# Patient Record
Sex: Male | Born: 1995 | Race: Black or African American | Hispanic: No | Marital: Single | Smoking: Never smoker
Health system: Southern US, Community
[De-identification: ages and names within clinical notes are randomized; demographics above are authoritative.]

## PROBLEM LIST (undated history)

## (undated) DIAGNOSIS — J309 Allergic rhinitis, unspecified: Secondary | ICD-10-CM

## (undated) DIAGNOSIS — M4120 Other idiopathic scoliosis, site unspecified: Secondary | ICD-10-CM

## (undated) DIAGNOSIS — K219 Gastro-esophageal reflux disease without esophagitis: Secondary | ICD-10-CM

## (undated) DIAGNOSIS — J45909 Unspecified asthma, uncomplicated: Secondary | ICD-10-CM

## (undated) DIAGNOSIS — F419 Anxiety disorder, unspecified: Secondary | ICD-10-CM

## (undated) HISTORY — DX: Unspecified asthma, uncomplicated: J45.909

## (undated) HISTORY — DX: Allergic rhinitis, unspecified: J30.9

## (undated) HISTORY — DX: Gastro-esophageal reflux disease without esophagitis: K21.9

## (undated) HISTORY — DX: Other idiopathic scoliosis, site unspecified: M41.20

## (undated) HISTORY — PX: WISDOM TOOTH EXTRACTION: SHX21

## (undated) HISTORY — DX: Anxiety disorder, unspecified: F41.9

---
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
## 2013-06-03 ENCOUNTER — Ambulatory Visit: Payer: Commercial Managed Care - PPO | Admitting: Family Medicine

## 2013-06-06 ENCOUNTER — Ambulatory Visit (INDEPENDENT_AMBULATORY_CARE_PROVIDER_SITE_OTHER): Payer: Commercial Managed Care - PPO | Admitting: Internal Medicine

## 2013-06-06 ENCOUNTER — Encounter: Payer: Self-pay | Admitting: Internal Medicine

## 2013-06-06 VITALS — BP 108/82 | HR 80 | Temp 98.6°F | Resp 16 | Wt 165.0 lb

## 2013-06-06 DIAGNOSIS — M545 Low back pain, unspecified: Secondary | ICD-10-CM | POA: Insufficient documentation

## 2013-06-06 DIAGNOSIS — M25532 Pain in left wrist: Secondary | ICD-10-CM

## 2013-06-06 DIAGNOSIS — M928 Other specified juvenile osteochondrosis: Secondary | ICD-10-CM

## 2013-06-06 DIAGNOSIS — S60219A Contusion of unspecified wrist, initial encounter: Secondary | ICD-10-CM

## 2013-06-06 DIAGNOSIS — M25579 Pain in unspecified ankle and joints of unspecified foot: Secondary | ICD-10-CM | POA: Insufficient documentation

## 2013-06-06 DIAGNOSIS — S60212A Contusion of left wrist, initial encounter: Secondary | ICD-10-CM

## 2013-06-06 DIAGNOSIS — M25539 Pain in unspecified wrist: Secondary | ICD-10-CM

## 2013-06-06 MED ORDER — IBUPROFEN 600 MG PO TABS: 600.0000 mg | ORAL_TABLET | Freq: Three times a day (TID) | ORAL | Status: DC | PRN

## 2013-06-06 NOTE — Progress Notes (Signed)
  Subjective:    HPI   C/o L wrist pain ( L handed)x 2-4 wks  C/o LBP worse w/ROM - C/o R ankle pain - twisted C/o B knee pain F/u anxiety - months (crowds, open spaces, shy) - doing ok   Review of Systems  Constitutional: Negative for chills, appetite change, fatigue and unexpected weight change.  HENT: Negative for congestion, nosebleeds, sneezing, sore throat and trouble swallowing.   Eyes: Negative for itching and visual disturbance.  Cardiovascular: Negative for leg swelling.  Gastrointestinal: Negative for diarrhea, blood in stool and abdominal distention.  Genitourinary: Negative for frequency, hematuria and testicular pain.  Musculoskeletal: Positive for arthralgias and back pain. Negative for gait problem, joint swelling and neck pain.  Skin: Negative for rash.  Neurological: Negative for tremors and speech difficulty.  Psychiatric/Behavioral: Negative for suicidal ideas, hallucinations, behavioral problems, sleep disturbance, dysphoric mood, decreased concentration and agitation. The patient is nervous/anxious.        Objective:   Physical Exam  Constitutional: He is oriented to person, place, and time. He appears well-developed and well-nourished. No distress.  Tall and thin, NAD  HENT:  Mouth/Throat: Oropharynx is clear and moist.  Eyes: Conjunctivae are normal. Pupils are equal, round, and reactive to light.  Neck: Normal range of motion. No JVD present. No thyromegaly present.  Cardiovascular: Normal rate, regular rhythm, normal heart sounds and intact distal pulses.  Exam reveals no gallop and no friction rub.   No murmur heard. Pulmonary/Chest: Effort normal and breath sounds normal. No respiratory distress. He has no wheezes. He has no rales. He exhibits no tenderness.  Abdominal: Soft. Bowel sounds are normal. He exhibits no distension and no mass. There is no tenderness. There is no rebound and no guarding.  Musculoskeletal: Normal range of motion. He exhibits  tenderness. He exhibits no edema.  LS R scolio L dors wrist is a little tender w/palp and ROM   Lymphadenopathy:    He has no cervical adenopathy.  Neurological: He is alert and oriented to person, place, and time. He has normal reflexes. No cranial nerve deficit. He exhibits normal muscle tone. Coordination normal.  Skin: Skin is warm and dry. No rash noted.  Psychiatric: He has a normal mood and affect. His behavior is normal. Judgment and thought content normal.  shy  L wrist is tender dorsally - ?cyst LS is tender     Assessment & Plan:    We discussed his diet and gym activities

## 2013-06-06 NOTE — Progress Notes (Signed)
Pre visit review using our clinic review tool, if applicable. No additional management support is needed unless otherwise documented below in the visit note. 

## 2013-06-06 NOTE — Assessment & Plan Note (Signed)
B knee pain Sports Med consult NSAIDs  Vit D

## 2013-06-06 NOTE — Assessment & Plan Note (Signed)
Arch supports

## 2013-06-06 NOTE — Assessment & Plan Note (Addendum)
2/15 ?dorsal ganglion Sports Med consult NSAIDs  Vit D

## 2013-06-06 NOTE — Assessment & Plan Note (Signed)
See Rx Sports med ref

## 2013-06-20 ENCOUNTER — Ambulatory Visit (INDEPENDENT_AMBULATORY_CARE_PROVIDER_SITE_OTHER): Payer: Commercial Managed Care - PPO | Admitting: Family Medicine

## 2013-06-20 ENCOUNTER — Encounter: Payer: Self-pay | Admitting: Family Medicine

## 2013-06-20 ENCOUNTER — Other Ambulatory Visit (INDEPENDENT_AMBULATORY_CARE_PROVIDER_SITE_OTHER): Payer: Commercial Managed Care - PPO

## 2013-06-20 VITALS — BP 120/70 | HR 69 | Temp 97.8°F | Resp 16 | Wt 162.1 lb

## 2013-06-20 DIAGNOSIS — M2140 Flat foot [pes planus] (acquired), unspecified foot: Secondary | ICD-10-CM

## 2013-06-20 DIAGNOSIS — M25579 Pain in unspecified ankle and joints of unspecified foot: Secondary | ICD-10-CM

## 2013-06-20 NOTE — Assessment & Plan Note (Signed)
Discussed OTC medications,  Icing and ankle brace.  HEP given OTC orthotics suggested.  Come back again in 3 weeks.

## 2013-06-20 NOTE — Patient Instructions (Signed)
It was nice meeting you today Terry West. Please perform your ankle exercises one to two x per day Please ice 1-2 x per day as well. Please get orthotics over the counter to help support your arch. I will see you back in 3-4 weeks to see how you are doing.

## 2013-06-20 NOTE — Progress Notes (Signed)
Tawana ScaleZach Smith D.O. [facility] [address] [place], [place] [address] Phone: [phone] Subjective:    I'm seeing this patient by the request  of:  Sonda PrimesAlex Plotnikov, MD   CC: Right ankle pain  NGE:[id number]:Subjective Terry West is a 18 y.o. male coming in with complaint of right ankle pain. Patient does work in [employer] he jumped off of a ledge causing him to roll his ankle about 3 weeks ago. Patient states that the swelling resolved after approximately 24 hours but continue to have some discomfort even with ambulation no. Patient states that the pain seems to be the whole ankle joint itself. Patient denies any clicking popping or giving on him. Patient denies any radiation of the pain but states that he has a dull ache that is worse with ambulation or standing for a long amount of time. Patient has not tried any over-the-counter medications for anything at this time. Patient has been told before that he had had ankles but did not know what that means. Patient rates the severity a 4/10. No nighttime awakening.     Past medical history, social, surgical and family history all reviewed in electronic medical record.   Review of Systems: No headache, visual changes, nausea, vomiting, diarrhea, constipation, dizziness, abdominal pain, skin rash, fevers, chills, night sweats, weight loss, swollen lymph nodes, body aches, joint swelling, muscle aches, chest pain, shortness of breath, mood changes.   Objective Blood pressure 120/70, pulse 69, temperature 97.8 F (36.6 C), temperature source Oral, resp. rate 16, weight 162 lb 1.3 oz (73.519 kg), SpO2 96.00%.  General: No apparent distress alert and oriented x3 mood and affect normal, dressed appropriately.  HEENT: Pupils equal, extraocular movements intact  Respiratory: Patient's speak in full sentences and does not appear short of breath  Cardiovascular: No lower extremity edema, non tender, no erythema  Skin: Warm dry intact with no  signs of infection or rash on extremities or on axial skeleton.  Abdomen: Soft nontender  Neuro: Cranial nerves II through XII are intact, neurovascularly intact in all extremities with 2+ DTRs and 2+ pulses.  Lymph: No lymphadenopathy of posterior or anterior cervical chain or axillae bilaterally.  Gait normal with good balance and coordination.  MSK:  Non tender with full range of motion and good stability and symmetric strength and tone of shoulders, elbows, wrist, hip, knee and ankles bilaterally. 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 Patient has breakdown spring-ligament. Mild tibial deviation of the large toe bilateral  MSK US performed of: right This study was ordered, performed, and interpreted by Terrilee FilesZach Smith D.O.  Foot/Ankle:   All structures visualized.   Talar dome unremarkable  Ankle mortise without effusion. Peroneus longus and brevis tendons unremarkable on long and transverse views without sheath effusions. Posterior tibialis, flexor hallucis longus, and flexor digitorum longus tendons unremarkable on long and transverse views without sheath effusions. Achilles tendon visualized along length of tendon and unremarkable on long and transverse views without sheath effusion. Anterior Talofibular Ligament and Calcaneofibular Ligaments unremarkable and intact. Deltoid Ligament unremarkable and intact. Plantar fascia intact and without effusion, normal thickness. No increased doppler signal, cap sign, or thickening of tibial cortex. Power doppler signal normal.  IMPRESSION:  NORMAL ULTRASONOGRAPHIC EXAMINATION OF THE FOOT/ANKLE.     Impression and Recommendations:     This case required medical  decision making of moderate complexity.

## 2013-06-20 NOTE — Progress Notes (Signed)
Pre visit review using our clinic review tool, if applicable. No additional management support is needed unless otherwise documented below in the visit note. 

## 2013-07-09 ENCOUNTER — Encounter: Payer: Self-pay | Admitting: Internal Medicine

## 2013-07-09 ENCOUNTER — Ambulatory Visit (INDEPENDENT_AMBULATORY_CARE_PROVIDER_SITE_OTHER): Payer: Commercial Managed Care - PPO | Admitting: Internal Medicine

## 2013-07-09 VITALS — BP 124/80 | HR 80 | Temp 99.4°F | Resp 16 | Wt 163.0 lb

## 2013-07-09 DIAGNOSIS — M545 Low back pain, unspecified: Secondary | ICD-10-CM

## 2013-07-09 DIAGNOSIS — J45909 Unspecified asthma, uncomplicated: Secondary | ICD-10-CM

## 2013-07-09 DIAGNOSIS — M928 Other specified juvenile osteochondrosis: Secondary | ICD-10-CM

## 2013-07-09 NOTE — Assessment & Plan Note (Signed)
Waxing and waning sx's

## 2013-07-09 NOTE — Progress Notes (Deleted)
Pre visit review using our clinic review tool, if applicable. No additional management support is needed unless otherwise documented below in the visit note. 

## 2013-07-09 NOTE — Assessment & Plan Note (Signed)
Continue with current prescription therapy as reflected on the Med list.  

## 2013-07-09 NOTE — Assessment & Plan Note (Signed)
Better  

## 2013-07-09 NOTE — Progress Notes (Deleted)
   Subjective:    Patient ID: Terry West, male    DOB: 1996-01-01, 18 y.o.   MRN: [id number]  HPI    Review of Systems     Objective:   Physical Exam        Assessment & Plan:

## 2013-07-09 NOTE — Progress Notes (Signed)
  Subjective:    HPI   F/u L wrist pain ( L handed)x 2-4 wks, LBP worse w/ROM, R ankle pain - better - he saw Dr Katrinka BlazingSmith F/u B knee pain  F/u anxiety - months (crowds, open spaces, shy) - doing ok   Review of Systems  Constitutional: Negative for chills, appetite change, fatigue and unexpected weight change.  HENT: Negative for congestion, nosebleeds, sneezing, sore throat and trouble swallowing.   Eyes: Negative for itching and visual disturbance.  Cardiovascular: Negative for leg swelling.  Gastrointestinal: Negative for diarrhea, blood in stool and abdominal distention.  Genitourinary: Negative for frequency, hematuria and testicular pain.  Musculoskeletal: Positive for arthralgias and back pain. Negative for gait problem, joint swelling and neck pain.  Skin: Negative for rash.  Neurological: Negative for tremors and speech difficulty.  Psychiatric/Behavioral: Negative for suicidal ideas, hallucinations, behavioral problems, sleep disturbance, dysphoric mood, decreased concentration and agitation. The patient is nervous/anxious.        Objective:   Physical Exam  Constitutional: He is oriented to person, place, and time. He appears well-developed and well-nourished. No distress.  Tall and thin, NAD  HENT:  Mouth/Throat: Oropharynx is clear and moist.  Eyes: Conjunctivae are normal. Pupils are equal, round, and reactive to light.  Neck: Normal range of motion. No JVD present. No thyromegaly present.  Cardiovascular: Normal rate, regular rhythm, normal heart sounds and intact distal pulses.  Exam reveals no gallop and no friction rub.   No murmur heard. Pulmonary/Chest: Effort normal and breath sounds normal. No respiratory distress. He has no wheezes. He has no rales. He exhibits no tenderness.  Abdominal: Soft. Bowel sounds are normal. He exhibits no distension and no mass. There is no tenderness. There is no rebound and no guarding.  Musculoskeletal: Normal range of motion. He  exhibits tenderness. He exhibits no edema.  LS R scolio L dors wrist is a little tender w/palp and ROM   Lymphadenopathy:    He has no cervical adenopathy.  Neurological: He is alert and oriented to person, place, and time. He has normal reflexes. No cranial nerve deficit. He exhibits normal muscle tone. Coordination normal.  Skin: Skin is warm and dry. No rash noted.  Psychiatric: He has a normal mood and affect. His behavior is normal. Judgment and thought content normal.  shy  L wrist is tender dorsally - ?cyst LS is less tender     Assessment & Plan:    We discussed his diet and gym activities

## 2013-09-05 ENCOUNTER — Ambulatory Visit (INDEPENDENT_AMBULATORY_CARE_PROVIDER_SITE_OTHER): Payer: Commercial Managed Care - PPO | Admitting: Internal Medicine

## 2013-09-05 ENCOUNTER — Encounter: Payer: Self-pay | Admitting: Internal Medicine

## 2013-09-05 VITALS — BP 118/82 | HR 80 | Temp 98.1°F | Resp 16 | Ht 70.5 in | Wt 163.0 lb

## 2013-09-05 DIAGNOSIS — H6120 Impacted cerumen, unspecified ear: Secondary | ICD-10-CM

## 2013-09-05 DIAGNOSIS — H6123 Impacted cerumen, bilateral: Secondary | ICD-10-CM | POA: Insufficient documentation

## 2013-09-05 NOTE — Progress Notes (Signed)
   Subjective:    HPI   C/o B ear fullness C/o R foot big toe bruise  Review of Systems  Constitutional: Negative for chills, appetite change, fatigue and unexpected weight change.  HENT: Negative for congestion, nosebleeds, sneezing, sore throat and trouble swallowing.   Eyes: Negative for itching and visual disturbance.  Cardiovascular: Negative for leg swelling.  Gastrointestinal: Negative for diarrhea, blood in stool and abdominal distention.  Genitourinary: Negative for frequency, hematuria and testicular pain.  Musculoskeletal: Positive for arthralgias and back pain. Negative for gait problem, joint swelling and neck pain.  Skin: Negative for rash.  Neurological: Negative for tremors and speech difficulty.  Psychiatric/Behavioral: Negative for suicidal ideas, hallucinations, behavioral problems, sleep disturbance, dysphoric mood, decreased concentration and agitation. The patient is nervous/anxious.        Objective:   Physical Exam  Constitutional: He is oriented to person, place, and time. He appears well-developed and well-nourished. No distress.  Tall and thin, NAD  HENT:  Mouth/Throat: Oropharynx is clear and moist.  Eyes: Conjunctivae are normal. Pupils are equal, round, and reactive to light.  Neck: Normal range of motion. No JVD present. No thyromegaly present.  Cardiovascular: Normal rate, regular rhythm, normal heart sounds and intact distal pulses.  Exam reveals no gallop and no friction rub.   No murmur heard. Pulmonary/Chest: Effort normal and breath sounds normal. No respiratory distress. He has no wheezes. He has no rales. He exhibits no tenderness.  Abdominal: Soft. Bowel sounds are normal. He exhibits no distension and no mass. There is no tenderness. There is no rebound and no guarding.  Musculoskeletal: Normal range of motion. He exhibits tenderness. He exhibits no edema.  LS R scolio L dors wrist is a little tender w/palp and ROM   Lymphadenopathy:   He has no cervical adenopathy.  Neurological: He is alert and oriented to person, place, and time. He has normal reflexes. No cranial nerve deficit. He exhibits normal muscle tone. Coordination normal.  Skin: Skin is warm and dry. No rash noted.  Psychiatric: He has a normal mood and affect. His behavior is normal. Judgment and thought content normal.  shy  Wax B Small hematoma R big toe   Procedure Note :     Procedure :  Ear irrigation   Indication:  Cerumen impaction B   Risks, including pain, dizziness, eardrum perforation, bleeding, infection and others as well as benefits were explained to the patient in detail. Verbal consent was obtained and the patient agreed to proceed.    We used "The Elephant Ear Irrigation Device" filled with lukewarm water for irrigation. A large amount wax was recovered. Procedure has also required manual wax removal with an ear loop.   Tolerated well. Complications: None.   Postprocedure instructions :  Call if problems.       Assessment & Plan:

## 2013-09-05 NOTE — Progress Notes (Signed)
Pre visit review using our clinic review tool, if applicable. No additional management support is needed unless otherwise documented below in the visit note. 

## 2013-12-03 ENCOUNTER — Ambulatory Visit (INDEPENDENT_AMBULATORY_CARE_PROVIDER_SITE_OTHER): Payer: Commercial Managed Care - PPO | Admitting: Family Medicine

## 2013-12-03 VITALS — BP 118/68 | HR 75 | Temp 97.7°F | Resp 16 | Ht 71.0 in | Wt 170.0 lb

## 2013-12-03 DIAGNOSIS — J029 Acute pharyngitis, unspecified: Secondary | ICD-10-CM

## 2013-12-03 LAB — POCT RAPID STREP A (OFFICE): Rapid Strep A Screen: NEGATIVE

## 2013-12-03 NOTE — Patient Instructions (Signed)
It does not look like you have strep. Start taking some claritin or zyrtec as needed for allergies.  I will be in touch with your culture results

## 2013-12-03 NOTE — Progress Notes (Addendum)
[facility] [address], [place] [address] [phone]  Date:  12/03/2013   Name:  Terry West   DOB:  1995/07/19   MRN:  [id number]  PCP:  Sonda Primes, MD    Chief Complaint: Sore Throat   History of Present Illness:  Terry West is a 18 y.o. very pleasant male patient who presents with the following:  He has noted a ST for about one week. It "kind of feels like I have something in my throat."  He was recently exposed to his nephew who had strep.   He has felt hot, tired, achy.   No GI symptoms.  He is generally in good health.  Patient Active Problem List   Diagnosis Date Noted  . Impacted cerumen of both ears 09/05/2013  . Pes planus 06/20/2013  . Left wrist pain 06/06/2013  . Pain in joint, ankle and foot 06/06/2013  . LBP (low back pain) 06/06/2013  . Well adult exam 11/27/2012  . Headache(784.0) 11/27/2012  . Contusion of wrist, left 11/27/2012  . Dyspepsia 09/27/2012  . Unspecified constipation 09/27/2012  . Testicle lump 09/27/2012  . Acute thoracic back pain 09/19/2011  . Anxiety disorder 09/19/2011  . Epistaxis 09/19/2011  . SCOLIOSIS 10/04/2009  . 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 GERD 11/30/2008  . 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 ALLERGIC RHINITIS 07/30/2008  . ASTHMA 07/30/2008    Past Medical History  Diagnosis Date  . Anxiety   . GERD 11/30/2008    Qualifier: Diagnosis of  By: Plotnikov MD, Georgina Quint   . ASTHMA 07/30/2008    Qualifier: Diagnosis of  By: Posey Rea MD, Georgina Quint ALLERGIC RHINITIS 07/30/2008    Qualifier: Diagnosis of  By: Plotnikov MD, Georgina Quint   . SCOLIOSIS 10/04/2009    LS spine -- to R      History reviewed. No pertinent past surgical history.  History  Substance Use Topics  . Smoking status: Never Smoker   . Smokeless tobacco: Not on file  . Alcohol Use: No    History reviewed. No pertinent family history.  No Known Allergies  Medication list has been reviewed and  updated.  Current Outpatient Prescriptions on File Prior to Visit  Medication Sig Dispense Refill  . ibuprofen (ADVIL,MOTRIN) 600 MG tablet Take 1 tablet (600 mg total) by mouth every 8 (eight) hours as needed (headache or pain).  60 tablet  3   No current facility-administered medications on file prior to visit.    Review of Systems:  As per HPI- otherwise negative. r  Physical Examination: Filed Vitals:   12/03/13 1517  BP: 118/68  Pulse: 75  Temp: 97.7 F (36.5 C)  Resp: 16   Filed Vitals:   12/03/13 1517  Height:  (1.803 m)  Weight: 170 lb (77.111 kg)   Body mass index is 23.72 kg/(m^2). Ideal Body Weight: Weight in (lb) to have BMI = 25: 178.9  GEN: WDWN, NAD, Non-toxic, A & O x 3, looks well HEENT: Atraumatic, Normocephalic. Neck supple. No masses, No LAD.  Bilateral TM wnl, oropharynx normal.  PEERL,EOMI.   Ears and Nose: No external deformity. CV: RRR, No M/G/R. No JVD. No thrill. No extra heart sounds. PULM: CTA B, no wheezes, crackles, rhonchi. No retractions. No resp. distress. No accessory muscle use. ABD: S, NT, ND EXTR: No c/c/e NEURO Normal gait.  PSYCH: Normally interactive. Conversant. Not depressed or anxious appearing.  Calm demeanor.  Results for orders placed in visit on 12/03/13  POCT RAPID STREP A (OFFICE)      Result Value Ref Range   Rapid Strep A Screen Negative  Negative    Assessment and Plan: Acute pharyngitis, unspecified pharyngitis type - Plan: POCT rapid strep A, Culture, Group A Strep  Does not appear to be strep.  Will try OTC medication for allergies while culture is pending   Signed Abbe Amsterdam, MD  Called 8/28 and [facility] (the number listed is for his mom)- he does have strep,but not group A.  Will treat with penicillin for 10 days. Called this in for him  Results for orders placed in visit on 12/03/13  CULTURE, GROUP A STREP      Result Value Ref Range   Organism ID, Bacteria STREPTOCOCCUS BETA HEMOLYTIC NOT  GROUP A    POCT RAPID STREP A (OFFICE)      Result Value Ref Range   Rapid Strep A Screen Negative  Negative

## 2013-12-04 LAB — CULTURE, GROUP A STREP

## 2013-12-05 MED ORDER — PENICILLIN V POTASSIUM 500 MG PO TABS: 500.0000 mg | ORAL_TABLET | Freq: Two times a day (BID) | ORAL | Status: DC

## 2013-12-05 NOTE — Addendum Note (Signed)
Addended by: Abbe Amsterdam C on: 12/05/2013 07:56 AM   Modules accepted: Orders

## 2014-01-21 ENCOUNTER — Encounter: Payer: Self-pay | Admitting: Family

## 2014-01-21 ENCOUNTER — Ambulatory Visit (INDEPENDENT_AMBULATORY_CARE_PROVIDER_SITE_OTHER): Payer: Commercial Managed Care - PPO | Admitting: Family

## 2014-01-21 VITALS — BP 142/82 | HR 80 | Temp 98.1°F | Resp 16 | Wt 172.8 lb

## 2014-01-21 DIAGNOSIS — J029 Acute pharyngitis, unspecified: Secondary | ICD-10-CM

## 2014-01-21 DIAGNOSIS — J020 Streptococcal pharyngitis: Secondary | ICD-10-CM

## 2014-01-21 LAB — POCT RAPID STREP A (OFFICE): Rapid Strep A Screen: NEGATIVE

## 2014-01-21 MED ORDER — AZITHROMYCIN 250 MG PO TABS: ORAL_TABLET | ORAL | Status: DC

## 2014-01-21 NOTE — Patient Instructions (Signed)
Thank you for choosing [facility].  Summary/Instructions:   Your prescription has been sent to your pharmacy. Please take the complete dose.  You may use over the counter medications for symptom relief. Allegra, Zytrec, Claritin for antihistamine. May also use Flonase and Mucinex.   If your symptoms worsen or fail to improve please just let us know.  Thank you for enrolling in MyChart. Please follow the instructions below to securely access your online medical record. MyChart allows you to send messages to your doctor, view your test results, renew your prescriptions, schedule appointments, and more.  How Do I Sign Up? 1. In your Internet browser, go to [email or web address]. 2. Click on the New  User? link in the Sign In box.  3. Enter your MyChart Access Code exactly as it appears below. You will not need to use this code after you have completed the sign-up process. If you do not sign up before the expiration date, you must request a new code. MyChart Access Code: [email or web address] Expires: 02/01/2014  4:50 PM  4. Enter the last four digits of your Social Security Number (xxxx) and Date of Birth (mm/dd/yyyy) as indicated and click Next. You will be taken to the next sign-up page. 5. Create a MyChart ID. This will be your MyChart login ID and cannot be changed, so think of one that is secure and easy to remember. 6. Create a MyChart password. You can change your password at any time. 7. Enter your Password Reset Question and Answer and click Next. This can be used at a later time if you forget your password.  8. Select your communication preference, and if applicable enter your e-mail address. You will receive e-mail notification when new information is available in MyChart by choosing to receive e-mail notifications and filling in your e-mail. 9. Click Sign In. You can now view your medical record.   Additional Information If you have questions, you can email  [email or web address] or call [phone] to talk to our MyChart staff. Remember, MyChart is NOT to be used for urgent needs. For medical emergencies, dial 911.

## 2014-01-21 NOTE — Progress Notes (Signed)
Pre visit review using our clinic review tool, if applicable. No additional management support is needed unless otherwise documented below in the visit note. 

## 2014-01-21 NOTE — Assessment & Plan Note (Signed)
Throat continues to remain with errythemia. Length of time of illness concerning for bacterial infection. Start Z-pack. Instructed to use OTC medications as needed for symptom relief. Advised to return if symptoms worsen or fail to improve with mediation. Pt in agreement with the plan.

## 2014-01-21 NOTE — Progress Notes (Signed)
   Subjective:    Patient ID: Terry West, male    DOB: 08-29-1995, 18 y.o.   MRN: [id number]  HPI:  Terry West is a 18 y.o. male who presents today for an acute office visit.   He has had a sore throat for about 2 weeks. Indicates he is not sure if ever really got better following the previous illness for which he was treated with Pennicillin. He rides a bicycle for work, and has noticed that will occasionally have wheezing in his chest. He continues to cough up mucus with no specified color and have stuffiness. Denies fever, chills, sinus pressure, nausea, vomiting, fatigue or body aches. Following penicillin treatment he has attempted ibuprofen.  No Known Allergies  Current Outpatient Prescriptions on File Prior to Visit  Medication Sig Dispense Refill  . ibuprofen (ADVIL,MOTRIN) 600 MG tablet Take 1 tablet (600 mg total) by mouth every 8 (eight) hours as needed (headache or pain).  60 tablet  3   No current facility-administered medications on file prior to visit.    Review of Systems  See HPI    Objective:    BP 142/82  Pulse 80  Temp(Src) 98.1 F (36.7 C) (Oral)  Resp 16  Wt 172 lb 12.8 oz (78.382 kg)  SpO2 98% Nursing note and vital signs reviewed.  Strep test negative.   Physical Exam  Constitutional: He is oriented to person, place, and time. He appears well-developed and well-nourished.  HENT:  Head: Normocephalic.  Right Ear: Hearing, tympanic membrane and external ear normal.  Left Ear: Hearing, tympanic membrane and external ear normal.  Nose: Nose normal.  Mouth/Throat: Mucous membranes are normal. Posterior oropharyngeal erythema present. No tonsillar abscesses.  Increased cerumen noted bilaterally Cobblestoning noted in posterior pharynx.  Cardiovascular: Normal rate, regular rhythm and normal heart sounds.   Pulmonary/Chest: Effort normal and breath sounds normal.  Lymphadenopathy:    He has no cervical adenopathy.  Neurological: He is  alert and oriented to person, place, and time.  Skin: Skin is warm and dry.  Psychiatric: He has a normal mood and affect. His behavior is normal. Judgment and thought content normal.        Assessment & Plan:

## 2015-01-06 ENCOUNTER — Encounter: Payer: Self-pay | Admitting: Internal Medicine

## 2015-01-06 ENCOUNTER — Other Ambulatory Visit: Payer: Commercial Managed Care - PPO

## 2015-01-06 ENCOUNTER — Ambulatory Visit (INDEPENDENT_AMBULATORY_CARE_PROVIDER_SITE_OTHER): Payer: Commercial Managed Care - PPO | Admitting: Internal Medicine

## 2015-01-06 VITALS — BP 120/74 | HR 95 | Wt 166.0 lb

## 2015-01-06 DIAGNOSIS — J029 Acute pharyngitis, unspecified: Secondary | ICD-10-CM | POA: Diagnosis not present

## 2015-01-06 DIAGNOSIS — H6123 Impacted cerumen, bilateral: Secondary | ICD-10-CM

## 2015-01-06 DIAGNOSIS — Z7721 Contact with and (suspected) exposure to potentially hazardous body fluids: Secondary | ICD-10-CM | POA: Insufficient documentation

## 2015-01-06 MED ORDER — VITAMIN D 1000 UNITS PO TABS: 1000.0000 [IU] | ORAL_TABLET | Freq: Every day | ORAL | Status: AC

## 2015-01-06 NOTE — Assessment & Plan Note (Signed)
Removed wax

## 2015-01-06 NOTE — Progress Notes (Signed)
Subjective:  Patient ID: Terry West, male    DOB: October 16, 1995  Age: 19 y.o. MRN: [id number]  CC: No chief complaint on file.   HPI Terry West presents for a mild ST. Needs STDs check - no sx's.  Outpatient Prescriptions Prior to Visit  Medication Sig Dispense Refill  . ibuprofen (ADVIL,MOTRIN) 600 MG tablet Take 1 tablet (600 mg total) by mouth every 8 (eight) hours as needed (headache or pain). 60 tablet 3  . azithromycin (ZITHROMAX Z-PAK) 250 MG tablet Take 2 pills today and 1 pill daily for 4 days. 6 each 0   No facility-administered medications prior to visit.    ROS Review of Systems  Constitutional: Negative for fever.  HENT: Positive for sore throat. Negative for postnasal drip and rhinorrhea.   Genitourinary: Negative for dysuria, urgency, frequency, hematuria, discharge, penile swelling, scrotal swelling, difficulty urinating, genital sores, penile pain and testicular pain.    Objective:  BP 120/74 mmHg  Pulse 95  Wt 166 lb (75.297 kg)  SpO2 97%  BP Readings from Last 3 Encounters:  01/06/15 120/74  01/21/14 142/82  12/03/13 118/68    Wt Readings from Last 3 Encounters:  01/06/15 166 lb (75.297 kg)  01/21/14 172 lb 12.8 oz (78.382 kg) (74 %*, Z = 0.64)  12/03/13 170 lb (77.111 kg) (71 %*, Z = 0.56)   * Growth percentiles are based on CDC 2-20 Years data.    Physical Exam  HENT:  Mouth/Throat: Oropharynx is clear and moist. No oropharyngeal exudate.  Cardiovascular: Normal rate.   No murmur heard. Pulmonary/Chest: He has no rales.  Skin: No rash noted.  Pt declined genital exam Wax B  Lab Results  Component Value Date   WBC 7.1 11/27/2012   HGB 14.7 11/27/2012   HCT 43.2 11/27/2012   PLT 220.0 11/27/2012   GLUCOSE 89 11/27/2012   CHOL 123 11/27/2012   TRIG 67.0 11/27/2012   HDL 43.80 11/27/2012   LDLCALC 66 11/27/2012   ALT 30 11/27/2012   AST 23 11/27/2012   NA 141 11/27/2012   K 4.7 11/27/2012   CL 105 11/27/2012   CREATININE 0.9 11/27/2012   BUN 14 11/27/2012   CO2 30 11/27/2012   TSH 1.39 11/27/2012    US Scrotum  10/02/2012   *RADIOLOGY REPORT*  Clinical Data: Left testicular lump on physical exam  ULTRASOUND OF SCROTUM  Technique:  Complete ultrasound examination of the testicles, epididymis, and other scrotal structures was performed.  Comparison:  None.  Findings:  Right testis:  The right testicle is normal in size and in echogenicity.  No intratesticular abnormality is seen.  There is blood flow demonstrated to the right testicle.  Left testis:  The left testicle also is normal in size and echogenicity.  Blood flow is demonstrated to the left testicle as well.  Right epididymis:  The right epididymis is unremarkable.  Left epididymis:  The left epididymis also is within normal limits.  Hydrocele:  No hydrocele is seen.  Varicocele:  No varicocele is noted.  IMPRESSION: Negative ultrasound of the scrotum.  No testicular lesion is seen.   Original Report Authenticated By: Dwyane Dee, M.D.    Assessment & Plan:   Diagnoses and all orders for this visit:  Impacted cerumen of both ears -     GC/chlamydia probe amp, urine; Future -     HIV antibody; Future -     RPR; Future  Patient exposure to body fluids  I have discontinued  [name]'s azithromycin. I am also having him maintain his ibuprofen.  No orders of the defined types were placed in this encounter.     Follow-up: No Follow-up on file.  Sonda Primes, MD

## 2015-01-06 NOTE — Progress Notes (Signed)
Pre visit review using our clinic review tool, if applicable. No additional management support is needed unless otherwise documented below in the visit note. 

## 2015-01-06 NOTE — Assessment & Plan Note (Signed)
Mild, viral OTC meds

## 2015-01-06 NOTE — Assessment & Plan Note (Signed)
Labs Safe sex discussed  

## 2015-01-06 NOTE — Addendum Note (Signed)
Addended by: Tresa Garter on: 01/06/2015 03:06 PM   Modules accepted: Orders

## 2015-01-07 LAB — RPR

## 2015-01-07 LAB — HIV ANTIBODY (ROUTINE TESTING W REFLEX): HIV: NONREACTIVE

## 2015-01-07 LAB — GC/CHLAMYDIA PROBE AMP, URINE
CHLAMYDIA, SWAB/URINE, PCR: NEGATIVE
GC Probe Amp, Urine: NEGATIVE

## 2015-08-27 ENCOUNTER — Ambulatory Visit (INDEPENDENT_AMBULATORY_CARE_PROVIDER_SITE_OTHER): Payer: Commercial Managed Care - PPO | Admitting: Internal Medicine

## 2015-08-27 ENCOUNTER — Encounter: Payer: Self-pay | Admitting: Internal Medicine

## 2015-08-27 VITALS — BP 112/78 | HR 92 | Temp 98.9°F | Wt 168.0 lb

## 2015-08-27 DIAGNOSIS — G44219 Episodic tension-type headache, not intractable: Secondary | ICD-10-CM

## 2015-08-27 DIAGNOSIS — J069 Acute upper respiratory infection, unspecified: Secondary | ICD-10-CM | POA: Diagnosis not present

## 2015-08-27 MED ORDER — AZITHROMYCIN 250 MG PO TABS: ORAL_TABLET | ORAL | Status: DC

## 2015-08-27 NOTE — Progress Notes (Signed)
Pre visit review using our clinic review tool, if applicable. No additional management support is needed unless otherwise documented below in the visit note. 

## 2015-08-27 NOTE — Progress Notes (Signed)
Subjective:  Patient ID: Terry West, male    DOB: 09-02-1995  Age: 20 y.o. MRN: [id number]  CC: No chief complaint on file.   HPI Terry West presents for ST, cough - yellow d/c. C/o occ HAs  Outpatient Prescriptions Prior to Visit  Medication Sig Dispense Refill  . cholecalciferol (VITAMIN D) 1000 UNITS tablet Take 1 tablet (1,000 Units total) by mouth daily. 100 tablet 3  . ibuprofen (ADVIL,MOTRIN) 600 MG tablet Take 1 tablet (600 mg total) by mouth every 8 (eight) hours as needed (headache or pain). 60 tablet 3   No facility-administered medications prior to visit.    ROS Review of Systems  Constitutional: Negative for appetite change, fatigue and unexpected weight change.  HENT: Positive for sore throat. Negative for congestion, nosebleeds, sneezing and trouble swallowing.   Eyes: Negative for itching and visual disturbance.  Respiratory: Negative for cough.   Cardiovascular: Negative for chest pain, palpitations and leg swelling.  Gastrointestinal: Negative for nausea, diarrhea, blood in stool and abdominal distention.  Genitourinary: Negative for frequency and hematuria.  Musculoskeletal: Negative for back pain, joint swelling, gait problem and neck pain.  Skin: Negative for rash.  Neurological: Positive for headaches. Negative for dizziness, tremors, speech difficulty and weakness.  Psychiatric/Behavioral: Negative for sleep disturbance, dysphoric mood and agitation. The patient is not nervous/anxious.     Objective:  BP 112/78 mmHg  Pulse 92  Temp(Src) 98.9 F (37.2 C) (Oral)  Wt 168 lb (76.204 kg)  SpO2 97%  BP Readings from Last 3 Encounters:  08/27/15 112/78  01/06/15 120/74  01/21/14 142/82    Wt Readings from Last 3 Encounters:  08/27/15 168 lb (76.204 kg)  01/06/15 166 lb (75.297 kg)  01/21/14 172 lb 12.8 oz (78.382 kg) (74 %*, Z = 0.64)   * Growth percentiles are based on CDC 2-20 Years data.    Physical Exam  Constitutional: He is  oriented to person, place, and time. He appears well-developed. No distress.  NAD  HENT:  Mouth/Throat: Oropharynx is clear and moist.  Eyes: Conjunctivae are normal. Pupils are equal, round, and reactive to light.  Neck: Normal range of motion. No JVD present. No thyromegaly present.  Cardiovascular: Normal rate, regular rhythm, normal heart sounds and intact distal pulses.  Exam reveals no gallop and no friction rub.   No murmur heard. Pulmonary/Chest: Effort normal and breath sounds normal. No respiratory distress. He has no wheezes. He has no rales. He exhibits no tenderness.  Abdominal: Soft. Bowel sounds are normal. He exhibits no distension and no mass. There is no tenderness. There is no rebound and no guarding.  Musculoskeletal: Normal range of motion. He exhibits no edema or tenderness.  Lymphadenopathy:    He has no cervical adenopathy.  Neurological: He is alert and oriented to person, place, and time. He has normal reflexes. No cranial nerve deficit. He exhibits normal muscle tone. He displays a negative Romberg sign. Coordination and gait normal.  Skin: Skin is warm and dry. No rash noted.  Psychiatric: He has a normal mood and affect. His behavior is normal. Judgment and thought content normal.  eryth throat  Lab Results  Component Value Date   WBC 7.1 11/27/2012   HGB 14.7 11/27/2012   HCT 43.2 11/27/2012   PLT 220.0 11/27/2012   GLUCOSE 89 11/27/2012   CHOL 123 11/27/2012   TRIG 67.0 11/27/2012   HDL 43.80 11/27/2012   LDLCALC 66 11/27/2012   ALT 30 11/27/2012   AST  23 11/27/2012   NA 141 11/27/2012   K 4.7 11/27/2012   CL 105 11/27/2012   CREATININE 0.9 11/27/2012   BUN 14 11/27/2012   CO2 30 11/27/2012   TSH 1.39 11/27/2012    US Scrotum  10/02/2012  *RADIOLOGY REPORT* Clinical Data: Left testicular lump on physical exam ULTRASOUND OF SCROTUM Technique:  Complete ultrasound examination of the testicles, epididymis, and other scrotal structures was  performed. Comparison:  None. Findings: Right testis:  The right testicle is normal in size and in echogenicity.  No intratesticular abnormality is seen.  There is blood flow demonstrated to the right testicle. Left testis:  The left testicle also is normal in size and echogenicity.  Blood flow is demonstrated to the left testicle as well. Right epididymis:  The right epididymis is unremarkable. Left epididymis:  The left epididymis also is within normal limits. Hydrocele:  No hydrocele is seen. Varicocele:  No varicocele is noted. IMPRESSION: Negative ultrasound of the scrotum.  No testicular lesion is seen. Original Report Authenticated By: Dwyane Dee, M.D.    Assessment & Plan:   There are no diagnoses linked to this encounter. I am having Terry West maintain his ibuprofen, cholecalciferol, and Pseudoeph-Doxylamine-DM-APAP (NYQUIL PO).  Meds ordered this encounter  Medications  . Pseudoeph-Doxylamine-DM-APAP (NYQUIL PO)    Sig: Take by mouth as needed.     Follow-up: No Follow-up on file.  Sonda Primes, MD

## 2015-08-27 NOTE — Patient Instructions (Signed)
Use over-the-counter  "cold" medicines  such as "Tylenol cold" , "Advil cold",  "Mucinex" or" Mucinex D"  for cough and congestion.   Avoid decongestants if you have high blood pressure and use "Afrin" nasal spray for nasal congestion as directed instead. Use" Delsym" or" Robitussin" cough syrup varietis for cough.  You can use plain "Tylenol" or "Advil" for fever, chills and achyness. Use Halls or Ricola cough drops.  Please, make an appointment if you are not better or if you're worse.  

## 2015-08-27 NOTE — Assessment & Plan Note (Signed)
Advil cold

## 2015-08-27 NOTE — Assessment & Plan Note (Signed)
Zpac OTC meds 

## 2015-08-29 ENCOUNTER — Encounter: Payer: Self-pay | Admitting: Internal Medicine

## 2015-11-18 ENCOUNTER — Ambulatory Visit (INDEPENDENT_AMBULATORY_CARE_PROVIDER_SITE_OTHER): Payer: Commercial Managed Care - PPO | Admitting: Internal Medicine

## 2015-11-18 ENCOUNTER — Encounter: Payer: Self-pay | Admitting: Internal Medicine

## 2015-11-18 DIAGNOSIS — N529 Male erectile dysfunction, unspecified: Secondary | ICD-10-CM | POA: Diagnosis not present

## 2015-11-18 MED ORDER — SILDENAFIL CITRATE 20 MG PO TABS: ORAL_TABLET | ORAL | 1 refills | Status: DC

## 2015-11-18 NOTE — Progress Notes (Signed)
   Subjective:    Patient ID: Terry West, male    DOB: Mar 04, 1996, 20 y.o.   MRN: [id number]  HPI  Here to f/u with c/o an episode of ED where he could not maintain an erection for 1 day, might have been made worse with stress? This is after the previous day he had an ongoing erection for 2 hrs.  Very concerned, has not had this problem before.  Denies worsening depressive symptoms, suicidal ideation, or panic.  No prior hx significant ED problem, hypogonad or neurological problem Past Medical History:  Diagnosis Date  . ALLERGIC RHINITIS 07/30/2008   Qualifier: Diagnosis of  By: Plotnikov MD, Georgina QuintAleksei V   . Anxiety   . ASTHMA 07/30/2008   Qualifier: Diagnosis of  By: Plotnikov MD, Georgina QuintAleksei V   . GERD 11/30/2008   Qualifier: Diagnosis of  By: Plotnikov MD, Georgina QuintAleksei V   . SCOLIOSIS 10/04/2009   LS spine -- to R     No past surgical history on file.  reports that he has never smoked. He does not have any smokeless tobacco history on file. He reports that he does not drink alcohol or use drugs. family history is not on file. No Known Allergies Current Outpatient Prescriptions on File Prior to Visit  Medication Sig Dispense Refill  . cholecalciferol (VITAMIN D) 1000 UNITS tablet Take 1 tablet (1,000 Units total) by mouth daily. 100 tablet 3  . ibuprofen (ADVIL,MOTRIN) 600 MG tablet Take 1 tablet (600 mg total) by mouth every 8 (eight) hours as needed (headache or pain). 60 tablet 3  . Pseudoeph-Doxylamine-DM-APAP (NYQUIL PO) Take by mouth as needed.     No current facility-administered medications on file prior to visit.     Review of Systems All otherwise neg per pt     Objective:   Physical Exam BP 124/76   Pulse 68   Temp 98 F (36.7 C) (Oral)   Resp 20   Wt 172 lb (78 kg)   SpO2 96%   BMI 23.99 kg/m  VS noted,  Constitutional: Pt appears in no apparent distress HENT: Head: NCAT.  Right Ear: External ear normal.  Left Ear: External ear normal.  Eyes: . Pupils are  equal, round, and reactive to light. Conjunctivae and EOM are normal Neck: Normal range of motion. Neck supple.  Cardiovascular: Normal rate and regular rhythm.   Pulmonary/Chest: Effort normal and breath sounds without rales or wheezing.  Abd:  Soft, NT, ND, + BS GU: normal male ext genitalia Neurological: Pt is alert. Not confused , motor grossly intact Skin: Skin is warm. No rash, no LE edema Psychiatric: Pt behavior is normal. No agitation.      Assessment & Plan:

## 2015-11-18 NOTE — Patient Instructions (Signed)
Please take all new medication as prescribed - the revatio  Please continue all other medications as before, and refills have been done if requested.  Please have the pharmacy call with any other refills you may need.  Please keep your appointments with your specialists as you may have planned

## 2015-11-18 NOTE — Progress Notes (Signed)
Pre visit review using our clinic review tool, if applicable. No additional management support is needed unless otherwise documented below in the visit note. 

## 2015-11-18 NOTE — Assessment & Plan Note (Signed)
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

## 2016-02-08 ENCOUNTER — Ambulatory Visit: Payer: Commercial Managed Care - PPO | Admitting: Nurse Practitioner

## 2016-02-08 DIAGNOSIS — Z0289 Encounter for other administrative examinations: Secondary | ICD-10-CM

## 2017-02-07 ENCOUNTER — Encounter (HOSPITAL_COMMUNITY): Payer: Self-pay | Admitting: Emergency Medicine

## 2017-02-07 ENCOUNTER — Emergency Department (HOSPITAL_COMMUNITY)
Admission: EM | Admit: 2017-02-07 | Discharge: 2017-02-08 | Disposition: A | Discharge status: Home / Self Care | Payer: Self-pay | Attending: Emergency Medicine | Admitting: Emergency Medicine

## 2017-02-07 ENCOUNTER — Emergency Department (HOSPITAL_COMMUNITY): Payer: Self-pay

## 2017-02-07 DIAGNOSIS — J45909 Unspecified asthma, uncomplicated: Secondary | ICD-10-CM | POA: Insufficient documentation

## 2017-02-07 DIAGNOSIS — M25572 Pain in left ankle and joints of left foot: Secondary | ICD-10-CM | POA: Insufficient documentation

## 2017-02-07 NOTE — ED Triage Notes (Signed)
Patient missed his step and twisted his left ankle at work yesterday reports pain at left ankle .

## 2017-02-08 NOTE — Discharge Instructions (Signed)
Alternate 600 mg of ibuprofen and [id number] mg of Tylenol every 3 hours as needed for pain for the next 3-5 days. Do not exceed 4000 mg of Tylenol daily.  Apply ice for comfort.  Do some gentle stretching of the ankle as needed.  Use the crutches and ankle brace to apply compression and take pressure off the ankle.  Follow-up with primary care physician or orthopedics if symptoms persist greater than 1 week.  Return to the ED for any concerning signs or symptoms develop.

## 2017-02-08 NOTE — ED Notes (Signed)
Ortho at bedside.

## 2017-02-08 NOTE — Progress Notes (Signed)
Orthopedic Tech Progress Note Patient Details:  Sandria Balesrevor A Brandenburg 10/30/1995 [id number]  Ortho Devices Type of Ortho Device: ASO, Crutches Ortho Device/Splint Location: lle Ortho Device/Splint Interventions: Ordered, Application, Adjustment   Trinna PostMartinez, Reise Hietala J 02/08/2017, 12:59 AM

## 2017-02-08 NOTE — ED Provider Notes (Signed)
[facility] Provider Note   CSN: [id number] Arrival date & time: 02/07/17  2227     History   Chief Complaint Chief Complaint  Patient presents with  . Ankle Injury    HPI Terry West is a 21 y.o. male who presents today with chief complaint acute onset, waxing and waning left ankle pain since yesterday.  He states that while at work yesterday he inverted his ankle resulting in lateral ankle pain which does not radiate.  Pain is throbbing and sharp in nature at times.  Pain worsens with range of motion and ambulation.  He is able to bear weight on the ankle but it is somewhat painful.  Denies numbness, tingling, or weakness.  Has not tried anything for his symptoms.  Denies fall, head injury, or loss of consciousness.  The history is provided by the patient.    Past Medical History:  Diagnosis Date  . ALLERGIC RHINITIS 07/30/2008   Qualifier: Diagnosis of  By: Plotnikov MD, Georgina QuintAleksei V   . Anxiety   . ASTHMA 07/30/2008   Qualifier: Diagnosis of  By: Plotnikov MD, Georgina QuintAleksei V   . GERD 11/30/2008   Qualifier: Diagnosis of  By: Plotnikov MD, Georgina QuintAleksei V   . SCOLIOSIS 10/04/2009   LS spine -- to R      Patient Active Problem List   Diagnosis Date Noted  . Erectile dysfunction 11/18/2015  . Acute upper respiratory infection 08/27/2015  . Patient exposure to body fluids 01/06/2015  . Acute pharyngitis 01/21/2014  . Impacted cerumen of both ears 09/05/2013  . Pes planus 06/20/2013  . Left wrist pain 06/06/2013  . Pain in joint, ankle and foot 06/06/2013  . LBP (low back pain) 06/06/2013  . Well adult exam 11/27/2012  . Headache 11/27/2012  . Contusion of wrist, left 11/27/2012  . Dyspepsia 09/27/2012  . Unspecified constipation 09/27/2012  . Testicle lump 09/27/2012  . Acute thoracic back pain 09/19/2011  . Anxiety disorder 09/19/2011  . Epistaxis 09/19/2011  . SCOLIOSIS 10/04/2009  . 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 GERD 11/30/2008   . 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 ALLERGIC RHINITIS 07/30/2008  . ASTHMA 07/30/2008    History reviewed. No pertinent surgical history.     Home Medications    Prior to Admission medications   Medication Sig Start Date End Date Taking? Authorizing Provider  ibuprofen (ADVIL,MOTRIN) 600 MG tablet Take 1 tablet (600 mg total) by mouth every 8 (eight) hours as needed (headache or pain). 06/06/13   Plotnikov, Georgina QuintAleksei V, MD  Pseudoeph-Doxylamine-DM-APAP (NYQUIL PO) Take by mouth as needed.    [provider]  sildenafil (REVATIO) 20 MG tablet Take 3-5 tablets daily as needed 11/18/15   Corwin LevinsJohn, James W, MD    Family History No family history on file.  Social History Social History  Substance Use Topics  . Smoking status: Never Smoker  . Smokeless tobacco: Never Used  . Alcohol use No     Allergies   Patient has no known allergies.   Review of Systems Review of Systems  Musculoskeletal: Positive for arthralgias (Left ankle).  Neurological: Negative for weakness and numbness.     Physical Exam Updated Vital Signs BP (!) 121/54 (BP Location: Left Arm)   Pulse 76   Temp 98.6 F (37 C) (Oral)   Resp 16   Ht 6\' 1"  (1.854 m)   Wt 79.8 kg (176 lb)   SpO2 97%   BMI 23.22 kg/m   Physical  Exam  Constitutional: He is oriented to person, place, and time. He appears well-developed and well-nourished. No distress.  HENT:  Head: Normocephalic and atraumatic.  Eyes: Conjunctivae are normal. Right eye exhibits no discharge. Left eye exhibits no discharge.  Neck: No JVD present. No tracheal deviation present.  Cardiovascular: Normal rate and intact distal pulses.   2+ DP/PT pulses bilaterally  Pulmonary/Chest: Effort normal.  Abdominal: He exhibits no distension.  Musculoskeletal: Normal range of motion. He exhibits tenderness. He exhibits no edema or deformity.       Right ankle: Normal. Achilles tendon normal.       Left ankle: He exhibits normal range of  motion, no swelling, no ecchymosis, no deformity, no laceration and normal pulse. Tenderness. Lateral malleolus tenderness found. No medial malleolus, no AITFL, no CF ligament, no posterior TFL, no head of 5th metatarsal and no proximal fibula tenderness found. Achilles tendon normal.  Also  tender to palpation just inferior to the lateral malleolus.  No deformity, crepitus, ecchymosis, or warmth noted.  5/5 strength of BLE major muscle groups.  Antalgic gait, but able to Heel Walk and Toe Walk without difficulty.  Neurological: He is alert and oriented to person, place, and time. No sensory deficit. He exhibits normal muscle tone.  Fluent speech, no facial droop, sensation intact to soft touch of bilateral lower extremities  Skin: Skin is warm and dry. No erythema.  Psychiatric: He has a normal mood and affect. His behavior is normal.  Nursing note and vitals reviewed.    ED Treatments / Results  Labs (all labs ordered are listed, but only abnormal results are displayed) Labs Reviewed - No data to display  EKG  EKG Interpretation None       Radiology Dg Ankle Complete Left  Result Date: 02/07/2017 CLINICAL DATA:  Left ankle injury. EXAM: LEFT ANKLE COMPLETE - 3+ VIEW COMPARISON:  None. FINDINGS: There is no evidence of fracture, dislocation, or joint effusion. There is no evidence of arthropathy or other focal bone abnormality. Soft tissues are unremarkable. IMPRESSION: No fracture or dislocation of the left ankle. Electronically Signed   By: Deatra Robinson M.D.   On: 02/07/2017 22:58    Procedures Procedures (including critical care time)  Medications Ordered in ED Medications - No data to display   Initial Impression / Assessment and Plan / ED Course  I have reviewed the triage vital signs and the nursing notes.  Pertinent labs & imaging results that were available during my care of the patient were reviewed by me and considered in my medical decision making (see chart for  details).     Patient with left lateral ankle pain after inverting it while at work yesterday.  Afebrile, vital signs are stable.  No focal neurological deficits.  He is ambulatory without difficulty.  Radiographs show no evidence of fracture or dislocation.  Compartments are soft.  No evidence of Achilles tendon injury. RICE therapy indicated and discussed. Patient given ASO brace, crutches, and will follow up with orthopedist or primary care physician if symptoms persist greater than 1 week.  Discussed indications for return to the ED. Pt verbalized understanding of and agreement with plan and is safe for discharge home at this time.   Final Clinical Impressions(s) / ED Diagnoses   Final diagnoses:  Acute left ankle pain    New Prescriptions New Prescriptions   No medications on file     Bennye Alm 02/08/17 Sable Feil, MD 02/13/17  1644  

## 2017-02-08 NOTE — ED Notes (Signed)
Patient verbalized understanding of discharge instructions and denies any further needs or questions at this time. VS stable. Patient ambulatory with steady gait. Escorted to ED entrance in wheelchair.   

## 2017-02-08 NOTE — ED Notes (Signed)
Provided ice pack

## 2017-05-05 ENCOUNTER — Other Ambulatory Visit: Payer: Self-pay

## 2017-05-05 ENCOUNTER — Emergency Department (HOSPITAL_COMMUNITY)
Admission: EM | Admit: 2017-05-05 | Discharge: 2017-05-05 | Disposition: A | Discharge status: Home / Self Care | Payer: No Typology Code available for payment source | Attending: Emergency Medicine | Admitting: Emergency Medicine

## 2017-05-05 ENCOUNTER — Encounter (HOSPITAL_COMMUNITY): Payer: Self-pay | Admitting: Emergency Medicine

## 2017-05-05 DIAGNOSIS — J45909 Unspecified asthma, uncomplicated: Secondary | ICD-10-CM | POA: Diagnosis not present

## 2017-05-05 DIAGNOSIS — J029 Acute pharyngitis, unspecified: Secondary | ICD-10-CM

## 2017-05-05 LAB — RAPID STREP SCREEN (MED CTR MEBANE ONLY): Streptococcus, Group A Screen (Direct): NEGATIVE

## 2017-05-05 MED ORDER — METHYLPREDNISOLONE 4 MG PO TBPK: ORAL_TABLET | ORAL | 0 refills | Status: DC

## 2017-05-05 NOTE — ED Provider Notes (Signed)
[facility] EMERGENCY DEPARTMENT Provider Note   CSN: [id number] Arrival date & time: 05/05/17  0246     History   Chief Complaint Chief Complaint  Patient presents with  . Sore Throat   HPI  Blood pressure 134/84, pulse 78, temperature 98.9 F (37.2 C), temperature source Oral, resp. rate 16, SpO2 99 %.  Terry West is a 22 y.o. male complaining of sore throat onset 3 days ago with associated dry cough, he started taking antibiotics given to him by his mother, unknown type, dose he says that he took them occasionally and that helped with the cough but he has a sore throat and general malaise with hoarse voice persistent.  He denies fevers, chills, nausea, vomiting, decreased p.o. intake, chest pain, shortness of breath, change in bowel or bladder habits.  Past Medical History:  Diagnosis Date  . ALLERGIC RHINITIS 07/30/2008   Qualifier: Diagnosis of  By: Plotnikov MD, Georgina Quint   . Anxiety   . ASTHMA 07/30/2008   Qualifier: Diagnosis of  By: Plotnikov MD, Georgina Quint   . GERD 11/30/2008   Qualifier: Diagnosis of  By: Plotnikov MD, Georgina Quint   . SCOLIOSIS 10/04/2009   LS spine -- to R      Patient Active Problem List   Diagnosis Date Noted  . Erectile dysfunction 11/18/2015  . Acute upper respiratory infection 08/27/2015  . Patient exposure to body fluids 01/06/2015  . Acute pharyngitis 01/21/2014  . Impacted cerumen of both ears 09/05/2013  . Pes planus 06/20/2013  . Left wrist pain 06/06/2013  . Pain in joint, ankle and foot 06/06/2013  . LBP (low back pain) 06/06/2013  . Well adult exam 11/27/2012  . Headache 11/27/2012  . Contusion of wrist, left 11/27/2012  . Dyspepsia 09/27/2012  . Unspecified constipation 09/27/2012  . Testicle lump 09/27/2012  . Acute thoracic back pain 09/19/2011  . Anxiety disorder 09/19/2011  . Epistaxis 09/19/2011  . SCOLIOSIS 10/04/2009  . 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 GERD 11/30/2008  . OSGOOD SCHLATTER'S  DISEASE 11/30/2008  . ALLERGIC RHINITIS 07/30/2008  . ASTHMA 07/30/2008    History reviewed. No pertinent surgical history.     Home Medications    Prior to Admission medications   Medication Sig Start Date End Date Taking? Authorizing Provider  ibuprofen (ADVIL,MOTRIN) 600 MG tablet Take 1 tablet (600 mg total) by mouth every 8 (eight) hours as needed (headache or pain). 06/06/13   Plotnikov, Georgina Quint, MD  methylPREDNISolone (MEDROL DOSEPAK) 4 MG TBPK tablet As directed 05/05/17   Yarel Rushlow, Joni Reining, PA-C  Pseudoeph-Doxylamine-DM-APAP (NYQUIL PO) Take by mouth as needed.    [provider]  sildenafil (REVATIO) 20 MG tablet Take 3-5 tablets daily as needed 11/18/15   Corwin Levins, MD    Family History No family history on file.  Social History Social History   Tobacco Use  . Smoking status: Never Smoker  . Smokeless tobacco: Never Used  Substance Use Topics  . Alcohol use: No  . Drug use: No     Allergies   Patient has no known allergies.   Review of Systems Review of Systems  A complete review of systems was obtained and all systems are negative except as noted in the HPI and PMH.   Physical Exam Updated Vital Signs BP 134/84 (BP Location: Right Arm)   Pulse 78   Temp 98.9 F (37.2 C) (Oral)   Resp 16   SpO2 99%   Physical Exam  Constitutional: He is oriented to person, place, and time. He appears well-developed and well-nourished. No distress.  HENT:  Head: Normocephalic and atraumatic.  Mouth/Throat: Oropharynx is clear and moist.  Eyes: Conjunctivae and EOM are normal. Pupils are equal, round, and reactive to light.  Neck: Normal range of motion.  Cardiovascular: Normal rate, regular rhythm and intact distal pulses.  Pulmonary/Chest: Effort normal and breath sounds normal.  Abdominal: Soft. There is no tenderness.  Musculoskeletal: Normal range of motion.  Neurological: He is alert and oriented to person, place, and time.  Skin: He is not  diaphoretic.  Psychiatric: He has a normal mood and affect.  Nursing note and vitals reviewed.    ED Treatments / Results  Labs (all labs ordered are listed, but only abnormal results are displayed) Labs Reviewed  RAPID STREP SCREEN (NOT AT [facility])  CULTURE, GROUP A STREP [facility]    EKG  EKG Interpretation None       Radiology No results found.  Procedures Procedures (including critical care time)  Medications Ordered in ED Medications - No data to display   Initial Impression / Assessment and Plan / ED Course  I have reviewed the triage vital signs and the nursing notes.  Pertinent labs & imaging results that were available during my care of the patient were reviewed by me and considered in my medical decision making (see chart for details).     Vitals:   05/05/17 0256  BP: 134/84  Pulse: 78  Resp: 16  Temp: 98.9 F (37.2 C)  TempSrc: Oral  SpO2: 99%    Terry West is 22 y.o. male presenting hoarse voice, sore throat and resolved cough onset several days ago.  Patient is taking antibiotics that were not prescribed to him.  I have advised him to DC this, will write him a Medrol Dosepak, advised rest, hydration and vocal rest.  Evaluation does not show pathology that would require ongoing emergent intervention or inpatient treatment. Pt is hemodynamically stable and mentating appropriately. Discussed findings and plan with patient/guardian, who agrees with care plan. All questions answered. Return precautions discussed and outpatient follow up given.    Final Clinical Impressions(s) / ED Diagnoses   Final diagnoses:  Acute pharyngitis, unspecified etiology    ED Discharge Orders        Ordered    methylPREDNISolone (MEDROL DOSEPAK) 4 MG TBPK tablet     05/05/17 0617       Jaimen Melone, Mardella Laymanicole, PA-C 05/05/17 [id number]    Azalia Bilisampos, Kevin, MD 05/05/17 [phone]

## 2017-05-05 NOTE — Discharge Instructions (Signed)
Please follow with your primary care doctor in the next 2 days for a check-up. They must obtain records for further management.  ° °Do not hesitate to return to the Emergency Department for any new, worsening or concerning symptoms.  ° °

## 2017-05-05 NOTE — ED Triage Notes (Signed)
C/o sore throat and difficulty swallowing since yesterday. 

## 2017-05-07 LAB — CULTURE, GROUP A STREP (THRC)

## 2017-05-30 ENCOUNTER — Ambulatory Visit: Payer: No Typology Code available for payment source | Admitting: Family

## 2017-05-30 ENCOUNTER — Encounter: Payer: Self-pay | Admitting: Family

## 2017-05-30 ENCOUNTER — Ambulatory Visit: Payer: Self-pay | Admitting: *Deleted

## 2017-05-30 VITALS — BP 110/80 | HR 75 | Temp 98.7°F | Ht 73.0 in | Wt 171.0 lb

## 2017-05-30 DIAGNOSIS — J069 Acute upper respiratory infection, unspecified: Secondary | ICD-10-CM

## 2017-05-30 MED ORDER — BENZONATATE 100 MG PO CAPS: 100.0000 mg | ORAL_CAPSULE | Freq: Three times a day (TID) | ORAL | 0 refills | Status: DC | PRN

## 2017-05-30 MED ORDER — AZITHROMYCIN 250 MG PO TABS: ORAL_TABLET | ORAL | 0 refills | Status: DC

## 2017-05-30 MED ORDER — FLUTICASONE PROPIONATE 50 MCG/ACT NA SUSP: 2.0000 | Freq: Every day | NASAL | 6 refills | Status: DC

## 2017-05-30 NOTE — Telephone Encounter (Signed)
Pt called stating that he was treating himself for the flu and he took some tussin CF for adults and started vomiting 20 minutes after taking medication; now he is having throat pain constantly (he says it feels like "muscle pain where the neck and chest meet"); the pt also says he has had a cold since Friday (cough, chest pain when coughing, fever, and chills); nurse triage initiated and recommendations made per protocol to include seeing a physician within 24 hours; pt scheduled appointment per agent Lorella Nimrod with Bas[facility] at [facility] at 1520 05/30/17; pt verbalizes understanding.  Reason for Disposition . [1] Continuous (nonstop) coughing interferes with work or school AND [2] no improvement using cough treatment per protocol  Answer Assessment - Initial Assessment Questions 1. ONSET: "When did the pain begin?"      05/29/17 around 1410 when pt vomited 2. LOCATION: "Where does it hurt?"     " Base of neck where the chest and neck meet"  3. PATTERN "Does the pain come and go, or has it been constant since it started?"      constant 4. SEVERITY: "How bad is the pain?"  (Scale 1-10; or mild, moderate, severe)   - MILD (1-3): doesn't interfere with normal activities    - MODERATE (4-7): interferes with normal activities or awakens from sleep    - SEVERE (8-10):  excruciating pain, unable to do any normal activities      Moderate; "makes cough a lot more" 5. RADIATION: "Does the pain go anywhere else, shoot into your arms?"     no 6. CORD SYMPTOMS: "Any weakness or numbness of the arms or legs?"     no 7. CAUSE: "What do you think is causing the neck pain?"     Gagging or vomiting 8. NECK OVERUSE: "Any recent activities that involved turning or twisting the neck?"     vomting 9. OTHER SYMPTOMS: "Do you have any other symptoms?" (e.g., headache, fever, chest pain, difficulty breathing, neck swelling)    Pt has had a cold with fever, chest pain, headache, cough 10. PREGNANCY: "Is there any  chance you are pregnant?" "When was your last menstrual period?"       n/a  Answer Assessment - Initial Assessment Questions 1. ONSET: "When did the cough begin?"      Friday 05/25/17 2. SEVERITY: "How bad is the cough today?"      moderate 3. RESPIRATORY DISTRESS: "Describe your breathing."      Not affecting breathing but can feel congestion I left lung 4. FEVER: "Do you have a fever?" If so, ask: "What is your temperature, how was it measured, and when did it start?"     Not sure 5. HEMOPTYSIS: "Are you coughing up any blood?" If so ask: "How much?" (flecks, streaks, tablespoons, etc.)     nmo 6. TREATMENT: "What have you done so far to treat the cough?" (e.g., meds, fluids, humidifier)     Cough syrup, equate cold and flu pills 7. CARDIAC HISTORY: "Do you have any history of heart disease?" (e.g., heart attack, congestive heart failure)      no 8. LUNG HISTORY: "Do you have any history of lung disease?"  (e.g., pulmonary embolus, asthma, emphysema)     o 9. PE RISK FACTORS: "Do you have a history of blood clots?" (or: recent major surgery, recent prolonged travel, bedridden )     no 10. OTHER SYMPTOMS: "Do you have any other symptoms? (e.g., runny nose, wheezing, chest pain)  Chest pain when coughing, chills, pain in neck after vomiting  11. PREGNANCY: "Is there any chance you are pregnant?" "When was your last menstrual period?"       n/a 12. TRAVEL: "Have you traveled out of the country in the last month?" (e.g., travel history, exposures)       no  Protocols used: COUGH - ACUTE NON-PRODUCTIVE-A-AH, NECK PAIN OR STIFFNESS-A-AH

## 2017-05-30 NOTE — Progress Notes (Signed)
Terry West is a 22 y.o. male with the following history as recorded in Epic[employer]:  Patient Active Problem List   Diagnosis Date Noted  . Acute pain of right knee 05/31/2017  . Chronic left shoulder pain 05/31/2017  . Post concussion syndrome 05/31/2017  . Erectile dysfunction 11/18/2015  . Patient exposure to body fluids 01/06/2015  . Pes planus 06/20/2013  . Left wrist pain 06/06/2013  . Pain in joint, ankle and foot 06/06/2013  . LBP (low back pain) 06/06/2013  . Well adult exam 11/27/2012  . Headache 11/27/2012  . Contusion of wrist, left 11/27/2012  . Dyspepsia 09/27/2012  . Unspecified constipation 09/27/2012  . Testicle lump 09/27/2012  . Acute thoracic back pain 09/19/2011  . Anxiety disorder 09/19/2011  . Epistaxis 09/19/2011  . SCOLIOSIS 10/04/2009  . 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 GERD 11/30/2008  . 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 ALLERGIC RHINITIS 07/30/2008  . ASTHMA 07/30/2008    Current Outpatient Medications  Medication Sig Dispense Refill  . ibuprofen (ADVIL,MOTRIN) 600 MG tablet Take 1 tablet (600 mg total) by mouth every 8 (eight) hours as needed (headache or pain). 60 tablet 3  . fluticasone (FLONASE) 50 MCG/ACT nasal spray Place 2 sprays into both nostrils daily. 16 g 6  . nitroGLYCERIN (NITRODUR - DOSED IN MG/24 HR) 0.2 mg/hr patch Cut and apply 1/4 patch to most painful area q24h. 30 patch 11   No current facility-administered medications for this visit.     Allergies: Patient has no known allergies.  Past Medical History:  Diagnosis Date  . ALLERGIC RHINITIS 07/30/2008   Qualifier: Diagnosis of  By: Plotnikov MD, Georgina QuintAleksei V   . Anxiety   . ASTHMA 07/30/2008   Qualifier: Diagnosis of  By: Plotnikov MD, Georgina QuintAleksei V   . GERD 11/30/2008   Qualifier: Diagnosis of  By: Plotnikov MD, Georgina QuintAleksei V   . SCOLIOSIS 10/04/2009   LS spine -- to R      History reviewed. No pertinent surgical history.  History reviewed. No pertinent family history.   Social History   Tobacco Use  . Smoking status: Never Smoker  . Smokeless tobacco: Never Used  Substance Use Topics  . Alcohol use: No    Subjective:  Last Friday, started suddenly with cough/ congestion/ body aches; was running a fever initially but this has improved; today, here with concerns for persisting cough; no OTC medication; denies any chest pain/ shortness of breath- does feel "heavy" through left side of chest;  was seen at ER at end of January with laryngitis- was put on steroid pack but feels that symptoms resolved completely.   Objective:  Vitals:   05/30/17 1540  BP: 110/80  Pulse: 75  Temp: 98.7 F (37.1 C)  TempSrc: Oral  SpO2: 99%  Weight: 171 lb (77.6 kg)  Height: 6\' 1"  (1.854 m)    General: Well developed, well nourished, in no acute distress  Skin : Warm and dry.  Head: Normocephalic and atraumatic  Eyes: Sclera and conjunctiva clear; pupils round and reactive to light; extraocular movements intact  Ears: External normal; canals clear; tympanic membranes normal  Oropharynx: Pink, supple. No suspicious lesions  Neck: Supple without thyromegaly, adenopathy  Lungs: Respirations unlabored; clear to auscultation bilaterally without wheeze, rales, rhonchi  CVS exam: normal rate and regular rhythm.  Neurologic: Alert and oriented; speech intact; face symmetrical; moves all extremities well; CNII-XII intact without focal deficit   Assessment:  1. Acute URI     Plan:  Trial of Flonase NS; increase fluids, rest; follow-up worse, no better and will consider antibiotic;  He will also return for follow-up with Dr. Katrinka Blazing or Dr. Curlene Labrum regarding his chronic knee pain that he mentions when leaving the office today.  No Follow-up on file.  No orders of the defined types were placed in this encounter.   Requested Prescriptions   Signed Prescriptions Disp Refills  . fluticasone (FLONASE) 50 MCG/ACT nasal spray 16 g 6    Sig: Place 2 sprays into both nostrils  daily.

## 2017-05-30 NOTE — Patient Instructions (Signed)
Please call back if you are not improved by early next week; at that point, would like to consider updating CXR and labs;

## 2017-05-31 ENCOUNTER — Encounter: Payer: Self-pay | Admitting: Family Medicine

## 2017-05-31 ENCOUNTER — Ambulatory Visit (INDEPENDENT_AMBULATORY_CARE_PROVIDER_SITE_OTHER): Payer: No Typology Code available for payment source | Admitting: Family Medicine

## 2017-05-31 VITALS — BP 118/68 | HR 68 | Temp 98.0°F | Ht 73.0 in | Wt 173.0 lb

## 2017-05-31 DIAGNOSIS — G8929 Other chronic pain: Secondary | ICD-10-CM | POA: Diagnosis not present

## 2017-05-31 DIAGNOSIS — F0781 Postconcussional syndrome: Secondary | ICD-10-CM

## 2017-05-31 DIAGNOSIS — M25561 Pain in right knee: Secondary | ICD-10-CM

## 2017-05-31 DIAGNOSIS — M25512 Pain in left shoulder: Secondary | ICD-10-CM | POA: Diagnosis not present

## 2017-05-31 MED ORDER — NITROGLYCERIN 0.2 MG/HR TD PT24: MEDICATED_PATCH | TRANSDERMAL | 11 refills | Status: DC

## 2017-05-31 NOTE — Assessment & Plan Note (Signed)
Pain is likely related to the pectoralis minor strain. No suggestion of rotator cuff on exam.  - Initiate nitroglycerin - Counseled on exercise - Advised follow-up in 6 weeks and improvement

## 2017-05-31 NOTE — Progress Notes (Signed)
Terry West - 22 y.o. male MRN [id number]  Date of birth: 13-Sep-1995  SUBJECTIVE:  Including CC & ROS.  Chief Complaint  Patient presents with  . Right knee pain  . Left shoulder pain    Terry West is a 22 y.o. male that is presenting with bilateral knee pain. Right knee is worse than left. Ongoing for two months. Pain is located  In the anterior aspect below his patella and radiates down his leg. Pain is worse when he is working out. He noticed it more when he is squatting heavier weights.      Left shoulder pain has been in increasing over the past four months. Pain is located in the medial aspect of his shoulder in the joint. Denies tingling or numbness. Worse when his lifting,states it feels like his muscle is going to tear. He has been applying tiger balm with no improvement.   He is active in jujitsu and was sparring about a year ago. At that time he at the back of his head. Since that time he feels like he has trouble gathering the right words when speaking with people. He did not have any workup at that time. He feels like the symptoms have been ongoing since last year. He feels like his personality change and other people as stated this as well. Sometimes becomes symptomatic when he watches things on TV. He does not sleep well at night. He is able to fall sleep but wakes up through the course of the night. If that occurs that he may go out and start exercising 3 or 4:00 in the morning.   Review of Systems  Constitutional: Negative for fever.  Respiratory: Negative for cough.   Cardiovascular: Negative for chest pain.  Gastrointestinal: Negative for abdominal pain.  Endocrine: Negative for polyuria.  Musculoskeletal: Negative for gait problem.  Skin: Negative for color change.  Allergic/Immunologic: Negative for immunocompromised state.  Neurological: Negative for weakness.  Hematological: Negative for adenopathy.  Psychiatric/Behavioral: Negative for agitation.     HISTORY: Past Medical, Surgical, Social, and Family History Reviewed & Updated per EMR.   Pertinent Historical Findings include:  Past Medical History:  Diagnosis Date  . ALLERGIC RHINITIS 07/30/2008   Qualifier: Diagnosis of  By: Plotnikov MD, Georgina QuintAleksei V   . Anxiety   . ASTHMA 07/30/2008   Qualifier: Diagnosis of  By: Plotnikov MD, Georgina QuintAleksei V   . GERD 11/30/2008   Qualifier: Diagnosis of  By: Plotnikov MD, Georgina QuintAleksei V   . SCOLIOSIS 10/04/2009   LS spine -- to R      No past surgical history on file.  No Known Allergies  No family history on file.   Social History   Socioeconomic History  . Marital status: Single    Spouse name: Not on file  . Number of children: Not on file  . Years of education: Not on file  . Highest education level: Not on file  Social Needs  . Financial resource strain: Not on file  . Food insecurity - worry: Not on file  . Food insecurity - inability: Not on file  . Transportation needs - medical: Not on file  . Transportation needs - non-medical: Not on file  Occupational History  . Not on file  Tobacco Use  . Smoking status: Never Smoker  . Smokeless tobacco: Never Used  Substance and Sexual Activity  . Alcohol use: No  . Drug use: No  . Sexual activity: Yes    Birth control/protection:  Condom    Comment: using condomes most of the time  Other Topics Concern  . Not on file  Social History Narrative  . Not on file     PHYSICAL EXAM:  VS: BP 118/68 (BP Location: Left Arm, Patient Position: Sitting, Cuff Size: Normal)   Pulse 68   Temp 98 F (36.7 C) (Oral)   Ht 6\' 1"  (1.854 m)   Wt 173 lb (78.5 kg)   SpO2 98%   BMI 22.82 kg/m  Physical Exam Gen: NAD, alert, cooperative with exam, well-appearing ENT: normal lips, normal nasal mucosa,  Eye: normal EOM, normal conjunctiva and lids CV:  no edema, +2 pedal pulses   Resp: no accessory muscle use, non-labored,   Skin: no rashes, no areas of induration  Neuro: normal tone, normal  sensation to touch, cranial nerve testing 2-12 intact Psych:  normal insight, alert and oriented MSK:  Right knee: No tenderness palpation of the medial or lateral joint line. No effusion. Normal flexion and extension. Negative McMurray's test. No pain with patellar grind Neurovascularly intact. Left shoulder: Normal active range of motion. Normal strength to resistance with external and internal rotation. Normal empty can testing. Normal speeds test. Tender to palpation over the coracoid and lateral upper aspect of the left chest.  Limited ultrasound: Right knee:  No effusion Normal appearance of the medial joint space as well as the lateral joint space  Summary: Normal exam  Ultrasound and interpretation by Clare Gandy, MD          ASSESSMENT & PLAN:   Acute pain of right knee Pain is likely tracking of the patella. No structural deformities based on ultrasound or exam. - Counseled on proper lifting technique when squatting heavier weights. He needs to make sure he has mobility of the ankles and avoid going the 90 with squatting. - if no improvement consider PT   Chronic left shoulder pain Pain is likely related to the pectoralis minor strain. No suggestion of rotator cuff on exam.  - Initiate nitroglycerin - Counseled on exercise - Advised follow-up in 6 weeks and improvement  Post concussion syndrome He had no formal diagnosis of concussion other than self-reported over a year ago. He reports having problems with tracking watching TV as well work finding. Does like his personality has changed as well. -Counseled on sleep hygiene -If no improvement consider referral to vestibular ocular rehab.

## 2017-05-31 NOTE — Assessment & Plan Note (Signed)
He had no formal diagnosis of concussion other than self-reported over a year ago. He reports having problems with tracking watching TV as well work finding. Does like his personality has changed as well. -Counseled on sleep hygiene -If no improvement consider referral to vestibular ocular rehab.

## 2017-05-31 NOTE — Patient Instructions (Addendum)
Make sure when you're doing heavy squats to keep your anemia about her ankle.  Making sure he has good ankle mobility will decrease her chance of having any form of knee pain while squatting. Tried negatives of an incline press to help with your pectoralis minor strain.  Once it feels better you can increase the negative in weight. Please try to sleep hygiene and I have provided for you.  Obtaining good rest will be to feeling better.  Nitroglycerin Protocol   Apply 1/4 nitroglycerin patch to affected area daily.  Change position of patch within the affected area every 24 hours.  You may experience a headache during the first 1-2 weeks of using the patch, these should subside.  If you experience headaches after beginning nitroglycerin patch treatment, you may take your preferred over the counter pain reliever.  Another side effect of the nitroglycerin patch is skin irritation or rash related to patch adhesive.  Please notify our office if you develop more severe headaches or rash, and stop the patch.  Tendon healing with nitroglycerin patch may require 12 to 24 weeks depending on the extent of injury.  Men should not use if taking Viagra, Cialis, or Levitra.   Do not use if you have migraines or rosacea.

## 2017-05-31 NOTE — Assessment & Plan Note (Signed)
Pain is likely tracking of the patella. No structural deformities based on ultrasound or exam. - Counseled on proper lifting technique when squatting heavier weights. He needs to make sure he has mobility of the ankles and avoid going the 90 with squatting. - if no improvement consider PT

## 2017-08-10 ENCOUNTER — Ambulatory Visit (INDEPENDENT_AMBULATORY_CARE_PROVIDER_SITE_OTHER): Payer: No Typology Code available for payment source | Admitting: Family

## 2017-08-10 ENCOUNTER — Other Ambulatory Visit: Payer: No Typology Code available for payment source

## 2017-08-10 ENCOUNTER — Encounter: Payer: Self-pay | Admitting: Family

## 2017-08-10 VITALS — BP 110/80 | HR 74 | Temp 97.9°F | Ht 73.0 in | Wt 169.1 lb

## 2017-08-10 DIAGNOSIS — Z113 Encounter for screening for infections with a predominantly sexual mode of transmission: Secondary | ICD-10-CM | POA: Diagnosis not present

## 2017-08-10 NOTE — Progress Notes (Signed)
Terry West is a 22 y.o. male with the following history as recorded in [employer]:  Patient Active Problem List   Diagnosis Date Noted  . Acute pain of right knee 05/31/2017  . Chronic left shoulder pain 05/31/2017  . Post concussion syndrome 05/31/2017  . Erectile dysfunction 11/18/2015  . Patient exposure to body fluids 01/06/2015  . Pes planus 06/20/2013  . Left wrist pain 06/06/2013  . Pain in joint, ankle and foot 06/06/2013  . LBP (low back pain) 06/06/2013  . Well adult exam 11/27/2012  . Headache 11/27/2012  . Contusion of wrist, left 11/27/2012  . Dyspepsia 09/27/2012  . Unspecified constipation 09/27/2012  . Testicle lump 09/27/2012  . Acute thoracic back pain 09/19/2011  . Anxiety disorder 09/19/2011  . Epistaxis 09/19/2011  . SCOLIOSIS 10/04/2009  . 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 GERD 11/30/2008  . 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 ALLERGIC RHINITIS 07/30/2008  . ASTHMA 07/30/2008    Current Outpatient Medications  Medication Sig Dispense Refill  . fluticasone (FLONASE) 50 MCG/ACT nasal spray Place 2 sprays into both nostrils daily. 16 g 6  . ibuprofen (ADVIL,MOTRIN) 600 MG tablet Take 1 tablet (600 mg total) by mouth every 8 (eight) hours as needed (headache or pain). 60 tablet 3  . nitroGLYCERIN (NITRODUR - DOSED IN MG/24 HR) 0.2 mg/hr patch Cut and apply 1/4 patch to most painful area q24h. 30 patch 11   No current facility-administered medications for this visit.     Allergies: Patient has no known allergies.  Past Medical History:  Diagnosis Date  . ALLERGIC RHINITIS 07/30/2008   Qualifier: Diagnosis of  By: Plotnikov MD, Georgina Quint   . Anxiety   . ASTHMA 07/30/2008   Qualifier: Diagnosis of  By: Plotnikov MD, Georgina Quint   . GERD 11/30/2008   Qualifier: Diagnosis of  By: Plotnikov MD, Georgina Quint   . SCOLIOSIS 10/04/2009   LS spine -- to R      History reviewed. No pertinent surgical history.  History reviewed. No pertinent family history.   Social History   Tobacco Use  . Smoking status: Never Smoker  . Smokeless tobacco: Never Used  Substance Use Topics  . Alcohol use: No    Subjective:  Requesting STD screen today; denies any known exposure or symptoms; patient provides no other history today;     Objective:  Vitals:   08/10/17 1133  BP: 110/80  Pulse: 74  Temp: 97.9 F (36.6 C)  TempSrc: Oral  SpO2: 99%  Weight: 169 lb 1.3 oz (76.7 kg)  Height:  (1.854 m)    General: Well developed, well nourished, in no acute distress  Skin : Warm and dry.  Head: Normocephalic and atraumatic  Lungs: Respirations unlabored; clear to auscultation bilaterally without wheeze, rales, rhonchi  Neurologic: Alert and oriented; speech intact; face symmetrical; moves all extremities well; CNII-XII intact without focal deficit  Assessment:  1. Screen for STD (sexually transmitted disease)     Plan:  Update labs as requested; follow-up to be determined.   No follow-ups on file.  Orders Placed This Encounter  Procedures  . GC/Chlamydia Probe Amp([facility])    Standing Status:   Future    Number of Occurrences:   1    Standing Expiration Date:   08/11/2018  . HIV antibody    Standing Status:   Future    Number of Occurrences:   1    Standing Expiration Date:   08/11/2018  . RPR  Standing Status:   Future    Number of Occurrences:   1    Standing Expiration Date:   08/10/2018    Requested Prescriptions    No prescriptions requested or ordered in this encounter

## 2017-08-11 LAB — HIV ANTIBODY (ROUTINE TESTING W REFLEX): HIV 1&2 Ab, 4th Generation: NONREACTIVE

## 2017-08-13 ENCOUNTER — Ambulatory Visit: Payer: No Typology Code available for payment source | Admitting: Family

## 2017-08-13 ENCOUNTER — Other Ambulatory Visit: Payer: Self-pay | Admitting: Family

## 2017-08-13 LAB — GC/CHLAMYDIA PROBE AMP
Chlamydia trachomatis, NAA: POSITIVE — AB
Neisseria gonorrhoeae by PCR: NEGATIVE

## 2017-08-13 LAB — RPR: RPR Ser Ql: NONREACTIVE

## 2017-08-13 MED ORDER — AZITHROMYCIN 250 MG PO TABS: ORAL_TABLET | ORAL | 0 refills | Status: DC

## 2018-06-28 ENCOUNTER — Emergency Department (HOSPITAL_COMMUNITY): Payer: No Typology Code available for payment source

## 2018-06-28 ENCOUNTER — Encounter (HOSPITAL_COMMUNITY): Payer: Self-pay | Admitting: *Deleted

## 2018-06-28 ENCOUNTER — Other Ambulatory Visit: Payer: Self-pay

## 2018-06-28 ENCOUNTER — Emergency Department (HOSPITAL_COMMUNITY)
Admission: EM | Admit: 2018-06-28 | Discharge: 2018-06-28 | Disposition: A | Discharge status: Home / Self Care | Payer: No Typology Code available for payment source | Attending: Emergency Medicine | Admitting: Emergency Medicine

## 2018-06-28 DIAGNOSIS — Z79899 Other long term (current) drug therapy: Secondary | ICD-10-CM | POA: Diagnosis not present

## 2018-06-28 DIAGNOSIS — Z77098 Contact with and (suspected) exposure to other hazardous, chiefly nonmedicinal, chemicals: Secondary | ICD-10-CM

## 2018-06-28 DIAGNOSIS — T59891A Toxic effect of other specified gases, fumes and vapors, accidental (unintentional), initial encounter: Secondary | ICD-10-CM | POA: Insufficient documentation

## 2018-06-28 DIAGNOSIS — J9801 Acute bronchospasm: Secondary | ICD-10-CM | POA: Diagnosis not present

## 2018-06-28 DIAGNOSIS — J45909 Unspecified asthma, uncomplicated: Secondary | ICD-10-CM | POA: Diagnosis not present

## 2018-06-28 DIAGNOSIS — T1490XA Injury, unspecified, initial encounter: Secondary | ICD-10-CM

## 2018-06-28 DIAGNOSIS — R0602 Shortness of breath: Secondary | ICD-10-CM | POA: Diagnosis present

## 2018-06-28 MED ORDER — IPRATROPIUM-ALBUTEROL 0.5-2.5 (3) MG/3ML IN SOLN
3.0000 mL | Freq: Once | RESPIRATORY_TRACT | Status: AC
  Administered 2018-06-28: 3 mL via RESPIRATORY_TRACT
  Filled 2018-06-28: qty 3

## 2018-06-28 MED ORDER — PREDNISONE 50 MG PO TABS: 50.0000 mg | ORAL_TABLET | Freq: Every day | ORAL | 0 refills | Status: DC

## 2018-06-28 NOTE — ED Provider Notes (Signed)
[facility]-EMERGENCY DEPT Provider Note   CSN: [id number] Arrival date & time: 06/28/18  0256    History   Chief Complaint Chief Complaint  Patient presents with  . Chemical Exposure    HPI Terry West is a 23 y.o. male.     HPI Patient presents to the emergency department with eye irritation and coughing with shortness of breath after mixing bleach and scrubbing a floor.  The patient states that he was maybe exposed to this for about 10 minutes.  Patient states the product he was using was not straight ammonia it was a cleaner with ammonia and it along with the bleach.  Patient states that he did have some chest tightness as well.  The patient denies chest pain, shortness of breath, headache,blurred vision, neck pain, fever, weakness, numbness, dizziness, anorexia, edema, abdominal pain, nausea, vomiting, diarrhea, rash, back pain, dysuria, hematemesis, bloody stool, near syncope, or syncope. Past Medical History:  Diagnosis Date  . ALLERGIC RHINITIS 07/30/2008   Qualifier: Diagnosis of  By: Plotnikov MD, Georgina Quint   . Anxiety   . ASTHMA 07/30/2008   Qualifier: Diagnosis of  By: Plotnikov MD, Georgina Quint   . GERD 11/30/2008   Qualifier: Diagnosis of  By: Plotnikov MD, Georgina Quint   . SCOLIOSIS 10/04/2009   LS spine -- to R      Patient Active Problem List   Diagnosis Date Noted  . Acute pain of right knee 05/31/2017  . Chronic left shoulder pain 05/31/2017  . Post concussion syndrome 05/31/2017  . Erectile dysfunction 11/18/2015  . Patient exposure to body fluids 01/06/2015  . Pes planus 06/20/2013  . Left wrist pain 06/06/2013  . Pain in joint, ankle and foot 06/06/2013  . LBP (low back pain) 06/06/2013  . Well adult exam 11/27/2012  . Headache 11/27/2012  . Contusion of wrist, left 11/27/2012  . Dyspepsia 09/27/2012  . Unspecified constipation 09/27/2012  . Testicle lump 09/27/2012  . Acute thoracic back pain 09/19/2011  . Anxiety disorder  09/19/2011  . Epistaxis 09/19/2011  . SCOLIOSIS 10/04/2009  . 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 GERD 11/30/2008  . 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 ALLERGIC RHINITIS 07/30/2008  . ASTHMA 07/30/2008    History reviewed. No pertinent surgical history.      Home Medications    Prior to Admission medications   Medication Sig Start Date End Date Taking? Authorizing Provider  azithromycin (ZITHROMAX) 250 MG tablet Take 4 tablets x 1 day 08/13/17   Olive Bass, FNP  fluticasone [facility]) 50 MCG/ACT nasal spray Place 2 sprays into both nostrils daily. 05/30/17   Olive Bass, FNP  ibuprofen (ADVIL,MOTRIN) 600 MG tablet Take 1 tablet (600 mg total) by mouth every 8 (eight) hours as needed (headache or pain). 06/06/13   Plotnikov, Georgina Quint, MD  nitroGLYCERIN (NITRODUR - DOSED IN MG/24 HR) 0.2 mg/hr patch Cut and apply 1/4 patch to most painful area q24h. 05/31/17   Myra Rude, MD    Family History No family history on file.  Social History Social History   Tobacco Use  . Smoking status: Never Smoker  . Smokeless tobacco: Never Used  Substance Use Topics  . Alcohol use: Yes    Comment: occassionally  . Drug use: No     Allergies   Patient has no known allergies.   Review of Systems Review of Systems All other systems negative except as documented in the HPI. All pertinent positives and negatives as  reviewed in the HPI.  Physical Exam Updated Vital Signs BP (!) 151/81 (BP Location: Left Arm)   Pulse 70   Temp 97.7 F (36.5 C) (Oral)   Resp 18   Ht 6\' 1"  (1.854 m)   Wt 79.4 kg   SpO2 100%   BMI 23.09 kg/m   Physical Exam Vitals signs and nursing note reviewed.  Constitutional:      General: He is not in acute distress.    Appearance: He is well-developed.  HENT:     Head: Normocephalic and atraumatic.  Eyes:     Pupils: Pupils are equal, round, and reactive to light.  Neck:     Musculoskeletal: Normal range of motion and neck  supple.  Cardiovascular:     Rate and Rhythm: Normal rate and regular rhythm.     Heart sounds: Normal heart sounds. No murmur. No friction rub. No gallop.   Pulmonary:     Effort: Pulmonary effort is normal. No respiratory distress.     Breath sounds: Normal breath sounds. No stridor. No wheezing or rhonchi.  Abdominal:     General: Bowel sounds are normal. There is no distension.     Palpations: Abdomen is soft.     Tenderness: There is no abdominal tenderness.  Skin:    General: Skin is warm and dry.     Capillary Refill: Capillary refill takes less than 2 seconds.     Findings: No erythema or rash.  Neurological:     Mental Status: He is alert and oriented to person, place, and time.     Motor: No abnormal muscle tone.     Coordination: Coordination normal.  Psychiatric:        Behavior: Behavior normal.      ED Treatments / Results  Labs (all labs ordered are listed, but only abnormal results are displayed) Labs Reviewed - No data to display  EKG None  Radiology Dg Chest 2 View  Result Date: 06/28/2018 CLINICAL DATA:  Initial evaluation for acute inhalational exposure. EXAM: CHEST - 2 VIEW COMPARISON:  Prior radiograph from 10/03/2009. FINDINGS: Cardiac and mediastinal silhouettes are stable in size and contour, and remain within normal limits. Lungs normally inflated. Mild increased prominence of the bronchial markings with subtle peribronchial thickening, likely reactive/inflammatory related to inhalational exposure. No focal inf infiltrates or consolidative opacity. No edema or effusion. No pneumothorax. Visualized osseous structures within normal limits. IMPRESSION: Subtly increased prominence of the bronchial markings with scattered peribronchial thickening, likely reactive/inflammatory related to recent inhalational exposure. Electronically Signed   By: Rise Mu M.D.   On: 06/28/2018 04:03    Procedures Procedures (including critical care time)   Medications Ordered in ED Medications  ipratropium-albuterol (DUONEB) 0.5-2.5 (3) MG/3ML nebulizer solution 3 mL (has no administration in time range)     Initial Impression / Assessment and Plan / ED Course  I have reviewed the triage vital signs and the nursing notes.  Pertinent labs & imaging results that were available during my care of the patient were reviewed by me and considered in my medical decision making (see chart for details).       Patient is having no respiratory distress here in the emergency department.  He was given a nebulizer treatment as there were some findings on the chest x-ray that showed mild inflammation.  Patient is advised to return here as needed told to follow-up with his primary doctor.  Final Clinical Impressions(s) / ED Diagnoses   Final diagnoses:  None  ED Discharge Orders    None       Charlestine Night, PA-C 06/28/18 0507    Ward, Layla Maw, DO 06/28/18 [phone]

## 2018-06-28 NOTE — ED Triage Notes (Signed)
Pt stated "I mixed ammonia and bleach approx 1 hour ago.  My eyes and throat started burning."

## 2018-06-28 NOTE — Discharge Instructions (Addendum)
Return here as needed.  Follow-up with your primary doctor. °

## 2018-10-31 ENCOUNTER — Ambulatory Visit (INDEPENDENT_AMBULATORY_CARE_PROVIDER_SITE_OTHER): Payer: No Typology Code available for payment source | Admitting: Internal Medicine

## 2018-10-31 ENCOUNTER — Other Ambulatory Visit (INDEPENDENT_AMBULATORY_CARE_PROVIDER_SITE_OTHER): Payer: No Typology Code available for payment source

## 2018-10-31 ENCOUNTER — Encounter: Payer: Self-pay | Admitting: Internal Medicine

## 2018-10-31 ENCOUNTER — Other Ambulatory Visit: Payer: Self-pay

## 2018-10-31 VITALS — BP 110/82 | HR 66 | Temp 98.5°F | Ht 73.0 in | Wt 176.0 lb

## 2018-10-31 DIAGNOSIS — Z0000 Encounter for general adult medical examination without abnormal findings: Secondary | ICD-10-CM | POA: Diagnosis not present

## 2018-10-31 DIAGNOSIS — Z7721 Contact with and (suspected) exposure to potentially hazardous body fluids: Secondary | ICD-10-CM

## 2018-10-31 LAB — BASIC METABOLIC PANEL
BUN: 13 mg/dL (ref 6–23)
CO2: 29 mEq/L (ref 19–32)
Calcium: 10.1 mg/dL (ref 8.4–10.5)
Chloride: 104 mEq/L (ref 96–112)
Creatinine, Ser: 1.08 mg/dL (ref 0.40–1.50)
GFR: 101.12 mL/min (ref >60.00)
Glucose, Bld: 100 mg/dL — ABNORMAL HIGH (ref 70–99)
Potassium: 4.3 mEq/L (ref 3.5–5.1)
Sodium: 139 mEq/L (ref 135–145)

## 2018-10-31 LAB — CBC WITH DIFFERENTIAL/PLATELET
Basophils Absolute: 0 10*3/uL (ref 0.0–0.1)
Basophils Relative: 0.1 % (ref 0.0–3.0)
Eosinophils Absolute: 0.1 10*3/uL (ref 0.0–0.7)
Eosinophils Relative: 1.9 % (ref 0.0–5.0)
HCT: 45.9 % (ref 39.0–52.0)
Hemoglobin: 15.4 g/dL (ref 13.0–17.0)
Lymphocytes Relative: 24.5 % (ref 12.0–46.0)
Lymphs Abs: 1.4 10*3/uL (ref 0.7–4.0)
MCHC: 33.5 g/dL (ref 30.0–36.0)
MCV: 87.4 fl (ref 78.0–100.0)
Monocytes Absolute: 0.4 10*3/uL (ref 0.1–1.0)
Monocytes Relative: 7.2 % (ref 3.0–12.0)
Neutro Abs: 3.7 10*3/uL (ref 1.4–7.7)
Neutrophils Relative %: 66.3 % (ref 43.0–77.0)
Platelets: 214 10*3/uL (ref 150.0–400.0)
RBC: 5.25 Mil/uL (ref 4.22–5.81)
RDW: 13.1 % (ref 11.5–15.5)
WBC: 5.5 10*3/uL (ref 4.0–10.5)

## 2018-10-31 LAB — LIPID PANEL
Cholesterol: 176 mg/dL (ref 0–200)
HDL: 47.8 mg/dL (ref >39.00)
LDL Cholesterol: 106 mg/dL — ABNORMAL HIGH (ref 0–99)
NonHDL: 128.52
Total CHOL/HDL Ratio: 4
Triglycerides: 114 mg/dL (ref 0.0–149.0)
VLDL: 22.8 mg/dL (ref 0.0–40.0)

## 2018-10-31 LAB — URINALYSIS
Bilirubin Urine: NEGATIVE
Hgb urine dipstick: NEGATIVE
Ketones, ur: NEGATIVE
Leukocytes,Ua: NEGATIVE
Nitrite: NEGATIVE
Specific Gravity, Urine: 1.02 (ref 1.000–1.030)
Total Protein, Urine: NEGATIVE
Urine Glucose: NEGATIVE
Urobilinogen, UA: 0.2 (ref 0.0–1.0)
pH: 6.5 (ref 5.0–8.0)

## 2018-10-31 LAB — TSH: TSH: 1.23 u[IU]/mL (ref 0.35–4.50)

## 2018-10-31 LAB — HEPATIC FUNCTION PANEL
ALT: 20 U/L (ref 0–53)
AST: 19 U/L (ref 0–37)
Albumin: 4.7 g/dL (ref 3.5–5.2)
Alkaline Phosphatase: 42 U/L (ref 39–117)
Bilirubin, Direct: 0.1 mg/dL (ref 0.0–0.3)
Total Bilirubin: 0.9 mg/dL (ref 0.2–1.2)
Total Protein: 7.6 g/dL (ref 6.0–8.3)

## 2018-10-31 MED ORDER — FEXOFENADINE HCL 180 MG PO TABS: 180.0000 mg | ORAL_TABLET | Freq: Every day | ORAL | 3 refills | Status: DC

## 2018-10-31 MED ORDER — B COMPLEX PO TABS: 1.0000 | ORAL_TABLET | Freq: Every day | ORAL | 3 refills | Status: DC

## 2018-10-31 NOTE — Assessment & Plan Note (Signed)
We discussed age appropriate health related issues, including available/recomended screening tests and vaccinations. We discussed a need for adhering to healthy diet and exercise. Labs were reviewed/ordered. All questions were answered. Age and sex related issues discussed (safe sex, seat belt use, etc.). Gardasil suggested, info given.  

## 2018-10-31 NOTE — Assessment & Plan Note (Addendum)
7/20 No sx's. Safe sex discussed STD labs

## 2018-10-31 NOTE — Progress Notes (Signed)
Subjective:  Patient ID: Terry West, male    DOB: 1995/10/24  Age: 23 y.o. MRN: [id number]  CC: Annual Exam   HPI Terry West presents for a well exam  Outpatient Medications Prior to Visit  Medication Sig Dispense Refill  . azithromycin (ZITHROMAX) 250 MG tablet Take 4 tablets x 1 day (Patient not taking: Reported on 10/31/2018) 4 tablet 0  . fluticasone (FLONASE) 50 MCG/ACT nasal spray Place 2 sprays into both nostrils daily. (Patient not taking: Reported on 10/31/2018) 16 g 6  . ibuprofen (ADVIL,MOTRIN) 600 MG tablet Take 1 tablet (600 mg total) by mouth every 8 (eight) hours as needed (headache or pain). (Patient not taking: Reported on 10/31/2018) 60 tablet 3  . nitroGLYCERIN (NITRODUR - DOSED IN MG/24 HR) 0.2 mg/hr patch Cut and apply 1/4 patch to most painful area q24h. (Patient not taking: Reported on 10/31/2018) 30 patch 11  . predniSONE (DELTASONE) 50 MG tablet Take 1 tablet (50 mg total) by mouth daily with breakfast. (Patient not taking: Reported on 10/31/2018) 5 tablet 0   No facility-administered medications prior to visit.     ROS: Review of Systems  Constitutional: Negative for appetite change, fatigue and unexpected weight change.  HENT: Negative for congestion, nosebleeds, sneezing, sore throat and trouble swallowing.   Eyes: Negative for itching and visual disturbance.  Respiratory: Negative for cough.   Cardiovascular: Negative for chest pain, palpitations and leg swelling.  Gastrointestinal: Negative for abdominal distention, blood in stool, diarrhea and nausea.  Genitourinary: Negative for frequency and hematuria.  Musculoskeletal: Negative for back pain, gait problem, joint swelling and neck pain.  Skin: Negative for rash.  Neurological: Negative for dizziness, tremors, speech difficulty and weakness.  Psychiatric/Behavioral: Negative for agitation, dysphoric mood, sleep disturbance and suicidal ideas. The patient is not nervous/anxious.      Objective:  BP 110/82 (BP Location: Left Arm, Patient Position: Sitting, Cuff Size: Normal)   Pulse 66   Temp 98.5 F (36.9 C) (Oral)   Ht 6\' 1"  (1.854 m)   Wt 176 lb (79.8 kg)   SpO2 98%   BMI 23.22 kg/m   BP Readings from Last 3 Encounters:  10/31/18 110/82  06/28/18 126/74  08/10/17 110/80    Wt Readings from Last 3 Encounters:  10/31/18 176 lb (79.8 kg)  06/28/18 175 lb (79.4 kg)  08/10/17 169 lb 1.3 oz (76.7 kg)    Physical Exam Constitutional:      General: He is not in acute distress.    Appearance: He is well-developed.     Comments: NAD  Eyes:     Conjunctiva/sclera: Conjunctivae normal.     Pupils: Pupils are equal, round, and reactive to light.  Neck:     Musculoskeletal: Normal range of motion.     Thyroid: No thyromegaly.     Vascular: No JVD.  Cardiovascular:     Rate and Rhythm: Normal rate and regular rhythm.     Heart sounds: Normal heart sounds. No murmur. No friction rub. No gallop.   Pulmonary:     Effort: Pulmonary effort is normal. No respiratory distress.     Breath sounds: Normal breath sounds. No wheezing or rales.  Chest:     Chest wall: No tenderness.  Abdominal:     General: Bowel sounds are normal. There is no distension.     Palpations: Abdomen is soft. There is no mass.     Tenderness: There is no abdominal tenderness. There is no guarding or rebound.  Musculoskeletal: Normal range of motion.        General: No tenderness.  Lymphadenopathy:     Cervical: No cervical adenopathy.  Skin:    General: Skin is warm and dry.     Findings: No rash.  Neurological:     Mental Status: He is alert and oriented to person, place, and time.     Cranial Nerves: No cranial nerve deficit.     Motor: No abnormal muscle tone.     Coordination: Coordination normal.     Gait: Gait normal.     Deep Tendon Reflexes: Reflexes are normal and symmetric.  Psychiatric:        Behavior: Behavior normal.        Thought Content: Thought content normal.         Judgment: Judgment normal.     Lab Results  Component Value Date   WBC 7.1 11/27/2012   HGB 14.7 11/27/2012   HCT 43.2 11/27/2012   PLT 220.0 11/27/2012   GLUCOSE 89 11/27/2012   CHOL 123 11/27/2012   TRIG 67.0 11/27/2012   HDL 43.80 11/27/2012   LDLCALC 66 11/27/2012   ALT 30 11/27/2012   AST 23 11/27/2012   NA 141 11/27/2012   K 4.7 11/27/2012   CL 105 11/27/2012   CREATININE 0.9 11/27/2012   BUN 14 11/27/2012   CO2 30 11/27/2012   TSH 1.39 11/27/2012    Dg Chest 2 View  Result Date: 06/28/2018 CLINICAL DATA:  Initial evaluation for acute inhalational exposure. EXAM: CHEST - 2 VIEW COMPARISON:  Prior radiograph from 10/03/2009. FINDINGS: Cardiac and mediastinal silhouettes are stable in size and contour, and remain within normal limits. Lungs normally inflated. Mild increased prominence of the bronchial markings with subtle peribronchial thickening, likely reactive/inflammatory related to inhalational exposure. No focal inf infiltrates or consolidative opacity. No edema or effusion. No pneumothorax. Visualized osseous structures within normal limits. IMPRESSION: Subtly increased prominence of the bronchial markings with scattered peribronchial thickening, likely reactive/inflammatory related to recent inhalational exposure. Electronically Signed   By: Rise MuBenjamin  McClintock M.D.   On: 06/28/2018 04:03    Assessment & Plan:   There are no diagnoses linked to this encounter.   No orders of the defined types were placed in this encounter.    Follow-up: No follow-ups on file.  Sonda PrimesAlex Kandra Graven, MD

## 2018-10-31 NOTE — Addendum Note (Signed)
Addended by: Isaiah Serge D on: 10/31/2018 03:32 PM   Modules accepted: Orders

## 2018-10-31 NOTE — Patient Instructions (Signed)
  Mediterranean diet is good for you. ([employer] has a typical Mediterranean cuisine menu) The Mediterranean diet is a way of eating based on the traditional cuisine of countries bordering the [place] Sea. While there is no single definition of the Mediterranean diet, it is typically high in vegetables, fruits, whole grains, beans, nut and seeds, and olive oil. The main components of Mediterranean diet include: . Daily consumption of vegetables, fruits, whole grains and healthy fats  . Weekly intake of fish, poultry, beans and eggs  . Moderate portions of dairy products  . Limited intake of red meat Other important elements of the Mediterranean diet are sharing meals with family and friends, enjoying a glass of red wine and being physically active. Health benefits of a Mediterranean diet: A traditional Mediterranean diet consisting of large quantities of fresh fruits and vegetables, nuts, fish and olive oil-coupled with physical activity-can reduce your risk of serious mental and physical health problems by: Preventing heart disease and strokes. Following a Mediterranean diet limits your intake of refined breads, processed foods, and red meat, and encourages drinking red wine instead of hard liquor-all factors that can help prevent heart disease and stroke. Keeping you agile. If you're an older adult, the nutrients gained with a Mediterranean diet may reduce your risk of developing muscle weakness and other signs of frailty by about 70 percent. Reducing the risk of Alzheimer's. Research suggests that the Mediterranean diet may improve cholesterol, blood sugar levels, and overall blood vessel health, which in turn may reduce your risk of Alzheimer's disease or dementia. Halving the risk of Parkinson's disease. The high levels of antioxidants in the Mediterranean diet can prevent cells from undergoing a damaging process called oxidative stress, thereby cutting the risk of Parkinson's disease in  half. Increasing longevity. By reducing your risk of developing heart disease or cancer with the Mediterranean diet, you're reducing your risk of death at any age by 20%. Protecting against type 2 diabetes. A Mediterranean diet is rich in fiber which digests slowly, prevents huge swings in blood sugar, and can help you maintain a healthy weight.   

## 2018-11-01 LAB — RPR: RPR Ser Ql: NONREACTIVE

## 2018-11-01 LAB — HIV ANTIBODY (ROUTINE TESTING W REFLEX): HIV 1&2 Ab, 4th Generation: NONREACTIVE

## 2018-11-06 ENCOUNTER — Other Ambulatory Visit: Payer: Self-pay | Admitting: Internal Medicine

## 2018-11-06 LAB — GC/CHLAMYDIA PROBE AMP
Chlamydia trachomatis, NAA: POSITIVE — AB
Neisseria Gonorrhoeae by PCR: NEGATIVE

## 2018-11-06 MED ORDER — DOXYCYCLINE HYCLATE 100 MG PO TABS
100.0000 mg | ORAL_TABLET | Freq: Two times a day (BID) | ORAL | 0 refills | Status: DC
Start: 2018-11-06 — End: 2019-06-06

## 2019-01-24 ENCOUNTER — Encounter (HOSPITAL_COMMUNITY): Payer: Self-pay | Admitting: Emergency Medicine

## 2019-01-24 ENCOUNTER — Emergency Department (HOSPITAL_COMMUNITY)
Admission: EM | Admit: 2019-01-24 | Discharge: 2019-01-24 | Disposition: A | Discharge status: Home / Self Care | Payer: No Typology Code available for payment source | Attending: Emergency Medicine | Admitting: Emergency Medicine

## 2019-01-24 ENCOUNTER — Other Ambulatory Visit: Payer: Self-pay

## 2019-01-24 ENCOUNTER — Emergency Department (HOSPITAL_COMMUNITY): Payer: No Typology Code available for payment source

## 2019-01-24 DIAGNOSIS — R4702 Dysphasia: Secondary | ICD-10-CM | POA: Diagnosis not present

## 2019-01-24 DIAGNOSIS — J45909 Unspecified asthma, uncomplicated: Secondary | ICD-10-CM | POA: Diagnosis not present

## 2019-01-24 DIAGNOSIS — R1312 Dysphagia, oropharyngeal phase: Secondary | ICD-10-CM

## 2019-01-24 NOTE — Discharge Instructions (Signed)
You will need to contact Dr. Janace Hoard, (Ear, Nose and Throat) for an appointment on Monday for further evaluation of possible foreign body if symptoms continue.   Recommend lots of cold fluids, Tylenol and/or ibuprofen as needed for discomfort.   Please return to the emergency department if you become unable to swallow liquids, develop a fever or new symptoms of concern.

## 2019-01-24 NOTE — ED Triage Notes (Signed)
Patient reports fish bone lodged in throat x30 minutes. Maintaining saliva and speaking in full sentences in triage.

## 2019-01-24 NOTE — ED Provider Notes (Signed)
[facility] DEPT Provider Note   CSN: [id number] Arrival date & time: 01/24/19  2016     History   Chief Complaint Chief Complaint  Patient presents with  . Swallowed Foreign Body    HPI Terry West is a 23 y.o. male.     Patient was eating fish around 7:00 pm this evening and describes that "it went down the wrong way". Since then he feels there is a fish bone lodged in the oropharynx. He is able to swallow but reports discomfort. No bleeding. No nausea or vomiting.  The history is provided by the patient. No language interpreter was used.  Swallowed Foreign Body    Past Medical History:  Diagnosis Date  . ALLERGIC RHINITIS 07/30/2008   Qualifier: Diagnosis of  By: Plotnikov MD, Evie Lacks   . Anxiety   . ASTHMA 07/30/2008   Qualifier: Diagnosis of  By: Plotnikov MD, Evie Lacks   . GERD 11/30/2008   Qualifier: Diagnosis of  By: Plotnikov MD, [place] 10/04/2009   LS spine -- to R      Patient Active Problem List   Diagnosis Date Noted  . Acute pain of right knee 05/31/2017  . Chronic left shoulder pain 05/31/2017  . Post concussion syndrome 05/31/2017  . Erectile dysfunction 11/18/2015  . Patient exposure to body fluids 01/06/2015  . Pes planus 06/20/2013  . Left wrist pain 06/06/2013  . Pain in joint, ankle and foot 06/06/2013  . LBP (low back pain) 06/06/2013  . Well adult exam 11/27/2012  . Headache 11/27/2012  . Contusion of wrist, left 11/27/2012  . Dyspepsia 09/27/2012  . Unspecified constipation 09/27/2012  . Testicle lump 09/27/2012  . Acute thoracic back pain 09/19/2011  . Anxiety disorder 09/19/2011  . Epistaxis 09/19/2011  . SCOLIOSIS 10/04/2009  . 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 GERD 11/30/2008  . 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 ALLERGIC RHINITIS 07/30/2008  . ASTHMA 07/30/2008    No past surgical history on file.      Home Medications    Prior to Admission medications    Medication Sig Start Date End Date Taking? Authorizing Provider  acetaminophen (TYLENOL) 325 MG tablet Take 650 mg by mouth every 6 (six) hours as needed for moderate pain.   Yes [provider]  b complex vitamins tablet Take 1 tablet by mouth daily. Patient not taking: Reported on 01/24/2019 10/31/18   Plotnikov, Evie Lacks, MD  doxycycline (VIBRA-TABS) 100 MG tablet Take 1 tablet (100 mg total) by mouth 2 (two) times daily. Patient not taking: Reported on 01/24/2019 11/06/18   Plotnikov, Evie Lacks, MD  fexofenadine [facility] ALLERGY) 180 MG tablet Take 1 tablet (180 mg total) by mouth daily. Patient not taking: Reported on 01/24/2019 10/31/18   Plotnikov, Evie Lacks, MD    Family History No family history on file.  Social History Social History   Tobacco Use  . Smoking status: Never Smoker  . Smokeless tobacco: Never Used  Substance Use Topics  . Alcohol use: Yes    Comment: occassionally  . Drug use: No     Allergies   Patient has no known allergies.   Review of Systems Review of Systems  Constitutional: Negative for fever.  HENT: Positive for sore throat. Negative for trouble swallowing.   Gastrointestinal: Negative for nausea.     Physical Exam Updated Vital Signs BP 137/81   Pulse 81   Temp 98.4 F (36.9 C) (Oral)  Resp 18   SpO2 100%   Physical Exam Vitals signs and nursing note reviewed.  HENT:     Mouth/Throat:     Mouth: Mucous membranes are moist.     Comments: No oropharyngeal FB visualized. No bleeding or swelling. No exudates. Maintaining own saliva. Tolerating PO fluids. Cardiovascular:     Rate and Rhythm: Normal rate.  Pulmonary:     Effort: Pulmonary effort is normal.  Neurological:     Mental Status: He is alert.      ED Treatments / Results  Labs (all labs ordered are listed, but only abnormal results are displayed) Labs Reviewed - No data to display  EKG None  Radiology Dg Neck Soft Tissue  Result Date: 01/24/2019  CLINICAL DATA:  Swallowed fish bone EXAM: NECK SOFT TISSUES - 1+ VIEW COMPARISON:  None. FINDINGS: There is no evidence of retropharyngeal soft tissue swelling or epiglottic enlargement. The cervical airway is unremarkable and no radio-opaque foreign body identified. IMPRESSION: Negative. Electronically Signed   By: Jonna Clark M.D.   On: 01/24/2019 22:05    Procedures Procedures (including critical care time)  Medications Ordered in ED Medications - No data to display   Initial Impression / Assessment and Plan / ED Course  I have reviewed the triage vital signs and the nursing notes.  Pertinent labs & imaging results that were available during my care of the patient were reviewed by me and considered in my medical decision making (see chart for details).        Patient to ED for evaluation of painful swallowing after eating fish earlier tonight. Able to swallow - reports he has eating bread and had fluids to drink without gagging or vomiting. No bleeding. Soft tissue neck imaging is unremarkable.   He is felt stable for discharge home. Will refer to ENT for outpatient follow up. REcommend Tylenol and/or ibuprofen for pain. Return precautions discussed.   Final Clinical Impressions(s) / ED Diagnoses   Final diagnoses:  None   1. Dysphagia  ED Discharge Orders    None       Elpidio Anis, Cordelia Poche 01/24/19 2239    Charlynne Pander, MD 01/25/19 1501

## 2019-06-06 ENCOUNTER — Encounter: Payer: Self-pay | Admitting: Internal Medicine

## 2019-06-06 ENCOUNTER — Ambulatory Visit (INDEPENDENT_AMBULATORY_CARE_PROVIDER_SITE_OTHER): Payer: No Typology Code available for payment source | Admitting: Internal Medicine

## 2019-06-06 DIAGNOSIS — J4520 Mild intermittent asthma, uncomplicated: Secondary | ICD-10-CM

## 2019-06-06 MED ORDER — DOXYCYCLINE HYCLATE 100 MG PO TABS: 100.0000 mg | ORAL_TABLET | Freq: Two times a day (BID) | ORAL | 0 refills | Status: DC

## 2019-06-06 NOTE — Progress Notes (Signed)
Virtual Visit via Video Note  I connected with Terry West on 06/06/19 at  8:40 AM EST by a video enabled telemedicine application and verified that I am speaking with the correct person using two identifiers.  The patient and the provider were at separate locations throughout the entire encounter.   I discussed the limitations of evaluation and management by telemedicine and the availability of in person appointments. The patient expressed understanding and agreed to proceed. The patient and the provider were the only parties present for the visit unless noted in HPI below.  History of Present Illness: The patient is a 24 y.o. man with visit for chest congestion and sinus pain. Started about 2 weeks or so ago. Has started taking allegra immediately and this helped a little. 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 He does have history of asthma and denies SOB. He does have some persistent chest congestion and cough. Some sinus pain/pressure as well. Denies fevers or chills. Overall it is not improving.   Observations/Objective: Appearance: normal, breathing appears normal, minimal cough during visit, casual grooming, abdomen does not appear distended, throat with mild drainage, memory normal, mental status is A and O times 3  Assessment and Plan: See problem oriented charting  Follow Up Instructions: rx doxycycline 1 week  I discussed the assessment and treatment plan with the patient. The patient was provided an opportunity to ask questions and all were answered. The patient agreed with the plan and demonstrated an understanding of the instructions.   The patient was advised to call back or seek an in-person evaluation if the symptoms worsen or if the condition fails to improve as anticipated.  Myrlene Broker, MD

## 2019-06-06 NOTE — Assessment & Plan Note (Signed)
No flare today but likely has some bacterial sinus build up. Rx doxycycline 1 week. No inhaler needed at this time. Call back for lack of improvement or worsening.

## 2019-12-25 ENCOUNTER — Encounter (HOSPITAL_COMMUNITY): Payer: Self-pay | Admitting: Emergency Medicine

## 2019-12-25 ENCOUNTER — Other Ambulatory Visit: Payer: Self-pay

## 2019-12-25 ENCOUNTER — Telehealth (HOSPITAL_COMMUNITY): Payer: Self-pay | Admitting: Urgent Care

## 2019-12-25 ENCOUNTER — Ambulatory Visit (HOSPITAL_COMMUNITY)
Admission: EM | Admit: 2019-12-25 | Discharge: 2019-12-25 | Disposition: A | Discharge status: Home / Self Care | Payer: No Typology Code available for payment source | Attending: Urgent Care | Admitting: Urgent Care

## 2019-12-25 DIAGNOSIS — Z20822 Contact with and (suspected) exposure to covid-19: Secondary | ICD-10-CM | POA: Diagnosis not present

## 2019-12-25 DIAGNOSIS — M419 Scoliosis, unspecified: Secondary | ICD-10-CM | POA: Insufficient documentation

## 2019-12-25 DIAGNOSIS — Z8709 Personal history of other diseases of the respiratory system: Secondary | ICD-10-CM

## 2019-12-25 DIAGNOSIS — J069 Acute upper respiratory infection, unspecified: Secondary | ICD-10-CM

## 2019-12-25 DIAGNOSIS — R070 Pain in throat: Secondary | ICD-10-CM

## 2019-12-25 DIAGNOSIS — J029 Acute pharyngitis, unspecified: Secondary | ICD-10-CM | POA: Insufficient documentation

## 2019-12-25 DIAGNOSIS — J3089 Other allergic rhinitis: Secondary | ICD-10-CM | POA: Diagnosis not present

## 2019-12-25 DIAGNOSIS — R0981 Nasal congestion: Secondary | ICD-10-CM | POA: Diagnosis not present

## 2019-12-25 LAB — SARS CORONAVIRUS 2 (TAT 6-24 HRS): SARS Coronavirus 2: NEGATIVE

## 2019-12-25 MED ORDER — ALBUTEROL SULFATE HFA 108 (90 BASE) MCG/ACT IN AERS: 1.0000 | INHALATION_SPRAY | Freq: Four times a day (QID) | RESPIRATORY_TRACT | 0 refills | Status: DC | PRN

## 2019-12-25 MED ORDER — PSEUDOEPHEDRINE HCL 60 MG PO TABS: 60.0000 mg | ORAL_TABLET | Freq: Three times a day (TID) | ORAL | 0 refills | Status: DC | PRN

## 2019-12-25 MED ORDER — CETIRIZINE HCL 10 MG PO TABS: 10.0000 mg | ORAL_TABLET | Freq: Every day | ORAL | 0 refills | Status: DC

## 2019-12-25 MED ORDER — BENZONATATE 100 MG PO CAPS: 100.0000 mg | ORAL_CAPSULE | Freq: Three times a day (TID) | ORAL | 0 refills | Status: DC | PRN

## 2019-12-25 MED ORDER — PROMETHAZINE-DM 6.25-15 MG/5ML PO SYRP: 5.0000 mL | ORAL_SOLUTION | Freq: Every evening | ORAL | 0 refills | Status: DC | PRN

## 2019-12-25 MED ORDER — BENZONATATE 100 MG PO CAPS
100.0000 mg | ORAL_CAPSULE | Freq: Three times a day (TID) | ORAL | 0 refills | Status: DC | PRN
Start: 2019-12-25 — End: 2021-02-21

## 2019-12-25 MED ORDER — CETIRIZINE HCL 10 MG PO TABS
10.0000 mg | ORAL_TABLET | Freq: Every day | ORAL | 0 refills | Status: DC
Start: 2019-12-25 — End: 2021-02-21

## 2019-12-25 NOTE — ED Triage Notes (Signed)
Pt presents with runny nose, chest heaviness, head congestion, sore throat xs 4 days. Denies loss of taste or smell, n,v,d.

## 2019-12-25 NOTE — Discharge Instructions (Addendum)

## 2019-12-25 NOTE — ED Provider Notes (Signed)
[facility]   MRN: [id number] DOB: 11-01-1995  Subjective:   Terry West is a 24 y.o. male presenting for 4 day hx of acute onset runny and stuffy nose, chest tightness, sinus pressure, sore throat. Denies having had COVID vaccination. Denies fever, cough, cp, shob, n/v/d.   Denies taking chronic medications.    No Known Allergies  Past Medical History:  Diagnosis Date  . ALLERGIC RHINITIS 07/30/2008   Qualifier: Diagnosis of  By: Plotnikov MD, Georgina Quint   . Anxiety   . ASTHMA 07/30/2008   Qualifier: Diagnosis of  By: Plotnikov MD, Georgina Quint   . GERD 11/30/2008   Qualifier: Diagnosis of  By: Plotnikov MD, Georgina Quint   . SCOLIOSIS 10/04/2009   LS spine -- to R       History reviewed. No pertinent surgical history.  History reviewed. No pertinent family history.  Social History   Tobacco Use  . Smoking status: Never Smoker  . Smokeless tobacco: Never Used  Vaping Use  . Vaping Use: Never used  Substance Use Topics  . Alcohol use: Yes    Comment: occassionally  . Drug use: No    ROS   Objective:   Vitals: BP 124/80 (BP Location: Left Arm)   Pulse 72   Temp 98.1 F (36.7 C) (Oral)   Resp 18   SpO2 100%   Physical Exam Constitutional:      General: He is not in acute distress.    Appearance: Normal appearance. He is well-developed and normal weight. He is not ill-appearing, toxic-appearing or diaphoretic.  HENT:     Head: Normocephalic and atraumatic.     Right Ear: Tympanic membrane, ear canal and external ear normal. There is no impacted cerumen.     Left Ear: Tympanic membrane, ear canal and external ear normal. There is no impacted cerumen.     Nose: Congestion present. No rhinorrhea.     Mouth/Throat:     Mouth: Mucous membranes are moist.     Pharynx: No oropharyngeal exudate or posterior oropharyngeal erythema.  Eyes:     General: No scleral icterus.       Right eye: No discharge.        Left eye: No discharge.      Extraocular Movements: Extraocular movements intact.     Conjunctiva/sclera: Conjunctivae normal.     Pupils: Pupils are equal, round, and reactive to light.  Cardiovascular:     Rate and Rhythm: Normal rate and regular rhythm.     Heart sounds: Normal heart sounds. No murmur heard.  No friction rub. No gallop.   Pulmonary:     Effort: Pulmonary effort is normal. No respiratory distress.     Breath sounds: Normal breath sounds. No stridor. No wheezing, rhonchi or rales.  Musculoskeletal:     Cervical back: Normal range of motion and neck supple. No rigidity. No muscular tenderness.  Neurological:     General: No focal deficit present.     Mental Status: He is alert and oriented to person, place, and time.  Psychiatric:        Mood and Affect: Mood normal.        Behavior: Behavior normal.        Thought Content: Thought content normal.      Assessment and Plan :   PDMP not reviewed this encounter.  1. Viral upper respiratory infection   2. Allergic rhinitis due to other allergic trigger, unspecified seasonality  3. History of asthma   4. Nasal congestion   5. Throat pain     Will manage for viral illness such as viral URI, viral syndrome, viral rhinitis, COVID-19. Counseled patient on nature of COVID-19 including modes of transmission, diagnostic testing, management and supportive care.  Offered scripts for symptomatic relief. COVID 19 testing is pending. Counseled patient on potential for adverse effects with medications prescribed/recommended today, ER and return-to-clinic precautions discussed, patient verbalized understanding.     Wallis Bamberg, PA-C 12/26/19 1012

## 2019-12-25 NOTE — Telephone Encounter (Signed)
Pt request all  Rx from today sent to retail pharmacy in [place].

## 2020-06-20 ENCOUNTER — Emergency Department (HOSPITAL_COMMUNITY)
Admission: EM | Admit: 2020-06-20 | Discharge: 2020-06-20 | Disposition: A | Discharge status: Home / Self Care | Payer: No Typology Code available for payment source | Attending: Emergency Medicine | Admitting: Emergency Medicine

## 2020-06-20 DIAGNOSIS — M542 Cervicalgia: Secondary | ICD-10-CM | POA: Insufficient documentation

## 2020-06-20 DIAGNOSIS — M79603 Pain in arm, unspecified: Secondary | ICD-10-CM | POA: Diagnosis present

## 2020-06-20 DIAGNOSIS — Y92410 Unspecified street and highway as the place of occurrence of the external cause: Secondary | ICD-10-CM | POA: Insufficient documentation

## 2020-06-20 DIAGNOSIS — Z5321 Procedure and treatment not carried out due to patient leaving prior to being seen by health care provider: Secondary | ICD-10-CM | POA: Diagnosis not present

## 2020-06-20 DIAGNOSIS — M79606 Pain in leg, unspecified: Secondary | ICD-10-CM | POA: Diagnosis not present

## 2020-06-20 NOTE — ED Notes (Signed)
Pt states he is just going to come back tomorrow

## 2020-06-21 ENCOUNTER — Ambulatory Visit (INDEPENDENT_AMBULATORY_CARE_PROVIDER_SITE_OTHER): Payer: No Typology Code available for payment source

## 2020-06-21 ENCOUNTER — Encounter (HOSPITAL_COMMUNITY): Payer: Self-pay

## 2020-06-21 ENCOUNTER — Ambulatory Visit (HOSPITAL_COMMUNITY)
Admission: EM | Admit: 2020-06-21 | Discharge: 2020-06-21 | Disposition: A | Discharge status: Home / Self Care | Payer: No Typology Code available for payment source | Attending: Emergency Medicine | Admitting: Emergency Medicine

## 2020-06-21 ENCOUNTER — Ambulatory Visit (HOSPITAL_COMMUNITY): Payer: No Typology Code available for payment source

## 2020-06-21 ENCOUNTER — Other Ambulatory Visit: Payer: Self-pay

## 2020-06-21 DIAGNOSIS — W109XXA Fall (on) (from) unspecified stairs and steps, initial encounter: Secondary | ICD-10-CM | POA: Diagnosis not present

## 2020-06-21 DIAGNOSIS — M25521 Pain in right elbow: Secondary | ICD-10-CM

## 2020-06-21 DIAGNOSIS — M25561 Pain in right knee: Secondary | ICD-10-CM

## 2020-06-21 DIAGNOSIS — R519 Headache, unspecified: Secondary | ICD-10-CM

## 2020-06-21 DIAGNOSIS — R6884 Jaw pain: Secondary | ICD-10-CM

## 2020-06-21 NOTE — ED Triage Notes (Signed)
Pt presents with complaints of falling over the weekend on Saturday. Reports he was drinking alcohol and fell down an unknown number of steps and has an abrasion to his right knee and under his chin. Pt is also having pain in his right elbow. Pt does not remember the incident from the alcohol usage. His friends explained to him what happened. Denies any other symptoms.

## 2020-06-21 NOTE — ED Provider Notes (Signed)
[facility]    CSN: [id number] Arrival date & time: 06/21/20  1505      History   Chief Complaint Chief Complaint  Patient presents with  . Fall    HPI Terry West is a 25 y.o. male.   Patient presents with abrasion on right knee, abrasion under chin, right elbow pain and right jaw pain after falling down stairs Saturday night while intoxicated. Unable to recall details of event. Associated pain and discomfort in all areas mentioned. ROM intact of knee and elbow. Feels clicking when closing mouth and painful to chew. History of prior jaw injury  Past Medical History:  Diagnosis Date  . ALLERGIC RHINITIS 07/30/2008   Qualifier: Diagnosis of  By: Plotnikov MD, Georgina Quint   . Anxiety   . ASTHMA 07/30/2008   Qualifier: Diagnosis of  By: Plotnikov MD, Georgina Quint   . GERD 11/30/2008   Qualifier: Diagnosis of  By: Plotnikov MD, Georgina Quint   . SCOLIOSIS 10/04/2009   LS spine -- to R      Patient Active Problem List   Diagnosis Date Noted  . Acute pain of right knee 05/31/2017  . Chronic left shoulder pain 05/31/2017  . Post concussion syndrome 05/31/2017  . Erectile dysfunction 11/18/2015  . Patient exposure to body fluids 01/06/2015  . Pes planus 06/20/2013  . Left wrist pain 06/06/2013  . Pain in joint, ankle and foot 06/06/2013  . LBP (low back pain) 06/06/2013  . Well adult exam 11/27/2012  . Headache 11/27/2012  . Contusion of wrist, left 11/27/2012  . Dyspepsia 09/27/2012  . Unspecified constipation 09/27/2012  . Testicle lump 09/27/2012  . Acute thoracic back pain 09/19/2011  . Anxiety disorder 09/19/2011  . Epistaxis 09/19/2011  . SCOLIOSIS 10/04/2009  . 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 GERD 11/30/2008  . 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 ALLERGIC RHINITIS 07/30/2008  . Asthma 07/30/2008    History reviewed. No pertinent surgical history.     Home Medications    Prior to Admission medications   Medication Sig Start Date End Date  Taking? Authorizing Provider  acetaminophen (TYLENOL) 325 MG tablet Take 650 mg by mouth every 6 (six) hours as needed for moderate pain.    [provider]  albuterol (VENTOLIN HFA) 108 (90 Base) MCG/ACT inhaler Inhale 1-2 puffs into the lungs every 6 (six) hours as needed for wheezing or shortness of breath. 12/25/19   Wallis Bamberg, PA-C  b complex vitamins tablet Take 1 tablet by mouth daily. Patient not taking: No sig reported 10/31/18   Plotnikov, Georgina Quint, MD  benzonatate (TESSALON) 100 MG capsule Take 1-2 capsules (100-200 mg total) by mouth 3 (three) times daily as needed for cough. 12/25/19   Wallis Bamberg, PA-C  cetirizine (ZYRTEC ALLERGY) 10 MG tablet Take 1 tablet (10 mg total) by mouth daily. 12/25/19   Wallis Bamberg, PA-C  doxycycline (VIBRA-TABS) 100 MG tablet Take 1 tablet (100 mg total) by mouth 2 (two) times daily. 06/06/19   Myrlene Broker, MD  fexofenadine [facility] ALLERGY) 180 MG tablet Take 1 tablet (180 mg total) by mouth daily. Patient not taking: No sig reported 10/31/18   Plotnikov, Georgina Quint, MD  promethazine-dextromethorphan (PROMETHAZINE-DM) 6.25-15 MG/5ML syrup Take 5 mLs by mouth at bedtime as needed for cough. 12/25/19   Wallis Bamberg, PA-C  pseudoephedrine (SUDAFED) 60 MG tablet Take 1 tablet (60 mg total) by mouth every 8 (eight) hours as needed for congestion. 12/25/19   Wallis Bamberg, PA-C  Family History Family History  Problem Relation Age of Onset  . Healthy Mother   . Healthy Father     Social History Social History   Tobacco Use  . Smoking status: Never Smoker  . Smokeless tobacco: Never Used  Vaping Use  . Vaping Use: Never used  Substance Use Topics  . Alcohol use: Yes    Comment: occassionally  . Drug use: No     Allergies   Patient has no known allergies.   Review of Systems Review of Systems  Constitutional: Negative.   HENT: Negative.   Respiratory: Negative.   Cardiovascular: Negative.   Musculoskeletal: Negative.    Neurological: Negative.      Physical Exam Triage Vital Signs ED Triage Vitals  Enc Vitals Group     BP 06/21/20 1534 140/67     Pulse Rate 06/21/20 1534 69     Resp 06/21/20 1534 19     Temp 06/21/20 1534 98 F (36.7 C)     Temp src --      SpO2 06/21/20 1534 100 %     Weight --      Height --      Head Circumference --      Peak Flow --      Pain Score 06/21/20 1533 7     Pain Loc --      Pain Edu? --      Excl. in GC? --    No data found.  Updated Vital Signs BP 140/67   Pulse 69   Temp 98 F (36.7 C)   Resp 19   SpO2 100%   Visual Acuity Right Eye Distance:   Left Eye Distance:   Bilateral Distance:    Right Eye Near:   Left Eye Near:    Bilateral Near:     Physical Exam Constitutional:      Appearance: Normal appearance. He is normal weight.  HENT:     Head: Normocephalic.     Jaw: Pain on movement present. No swelling.     Comments: Healing abrasion 2x3 located under chin, dried blood present  Eyes:     Extraocular Movements: Extraocular movements intact.  Pulmonary:     Effort: Pulmonary effort is normal.  Musculoskeletal:     Right elbow: Normal.     Left elbow: Normal.     Right knee: No swelling, deformity, effusion, erythema, ecchymosis, lacerations or bony tenderness. Normal range of motion.       Legs:     Comments: Tenderness over the entirity of the knee with palpation, healing abrasion present over right upper region, ROM intact, no swelling,   Skin:    General: Skin is warm and dry.  Neurological:     Mental Status: He is alert and oriented to person, place, and time. Mental status is at baseline.  Psychiatric:        Mood and Affect: Mood normal.        Behavior: Behavior normal.        Thought Content: Thought content normal.        Judgment: Judgment normal.      UC Treatments / Results  Labs (all labs ordered are listed, but only abnormal results are displayed) Labs Reviewed - No data to  display  EKG   Radiology No results found.  Procedures Procedures (including critical care time)  Medications Ordered in UC Medications - No data to display  Initial Impression / Assessment and Plan / UC Course  I have reviewed the triage vital signs and the nursing notes.  Pertinent labs & imaging results that were available during my care of the patient were reviewed by me and considered in my medical decision making (see chart for details).  Acute right jaw pain Acute right knee pain Acute right elbow pain   1. otc ibuprofen 600-800 mg every six hours 2. otc tylenol 650 mg every six hours as needed in addition for pain 3. Follow up with ortho for worsening persistent pain,swelling, numbness tingling, decreased rom  Final Clinical Impressions(s) / UC Diagnoses   Final diagnoses:  None   Discharge Instructions   None    ED Prescriptions    None     PDMP not reviewed this encounter.   Valinda Hoar, NP 06/21/20 1643

## 2020-06-21 NOTE — Discharge Instructions (Signed)
Take ibuprofen 600-800 mg every 6 hours for pain   Can use tylenol 650 mg every 6 hours with ibuprofen for additional relief  Follow up for persistent pain, worsening pain, decreased movement, numbness tingling

## 2021-02-21 ENCOUNTER — Other Ambulatory Visit: Payer: Self-pay

## 2021-02-21 ENCOUNTER — Encounter: Payer: Self-pay | Admitting: Internal Medicine

## 2021-02-21 ENCOUNTER — Ambulatory Visit (INDEPENDENT_AMBULATORY_CARE_PROVIDER_SITE_OTHER): Payer: No Typology Code available for payment source | Admitting: Internal Medicine

## 2021-02-21 ENCOUNTER — Telehealth: Payer: Self-pay | Admitting: Internal Medicine

## 2021-02-21 VITALS — BP 118/70 | HR 98 | Temp 99.6°F | Ht 73.0 in | Wt 193.8 lb

## 2021-02-21 DIAGNOSIS — Z202 Contact with and (suspected) exposure to infections with a predominantly sexual mode of transmission: Secondary | ICD-10-CM

## 2021-02-21 DIAGNOSIS — J4520 Mild intermittent asthma, uncomplicated: Secondary | ICD-10-CM

## 2021-02-21 DIAGNOSIS — Z7721 Contact with and (suspected) exposure to potentially hazardous body fluids: Secondary | ICD-10-CM

## 2021-02-21 DIAGNOSIS — J069 Acute upper respiratory infection, unspecified: Secondary | ICD-10-CM

## 2021-02-21 DIAGNOSIS — Z0000 Encounter for general adult medical examination without abnormal findings: Secondary | ICD-10-CM

## 2021-02-21 LAB — CBC WITH DIFFERENTIAL/PLATELET
Basophils Absolute: 0 10*3/uL (ref 0.0–0.1)
Basophils Relative: 0 % (ref 0.0–3.0)
Eosinophils Absolute: 0 10*3/uL (ref 0.0–0.7)
Eosinophils Relative: 0.3 % (ref 0.0–5.0)
HCT: 45.3 % (ref 39.0–52.0)
Hemoglobin: 15.3 g/dL (ref 13.0–17.0)
Lymphocytes Relative: 9.1 % — ABNORMAL LOW (ref 12.0–46.0)
Lymphs Abs: 0.7 10*3/uL (ref 0.7–4.0)
MCHC: 33.7 g/dL (ref 30.0–36.0)
MCV: 86.4 fl (ref 78.0–100.0)
Monocytes Absolute: 0.8 10*3/uL (ref 0.1–1.0)
Monocytes Relative: 9.9 % (ref 3.0–12.0)
Neutro Abs: 6.2 10*3/uL (ref 1.4–7.7)
Neutrophils Relative %: 80.7 % — ABNORMAL HIGH (ref 43.0–77.0)
Platelets: 209 10*3/uL (ref 150.0–400.0)
RBC: 5.25 Mil/uL (ref 4.22–5.81)
RDW: 12.7 % (ref 11.5–15.5)
WBC: 7.6 10*3/uL (ref 4.0–10.5)

## 2021-02-21 LAB — LIPID PANEL
Cholesterol: 159 mg/dL (ref 0–200)
HDL: 37.2 mg/dL — ABNORMAL LOW (ref >39.00)
LDL Cholesterol: 108 mg/dL — ABNORMAL HIGH (ref 0–99)
NonHDL: 121.7
Total CHOL/HDL Ratio: 4
Triglycerides: 71 mg/dL (ref 0.0–149.0)
VLDL: 14.2 mg/dL (ref 0.0–40.0)

## 2021-02-21 LAB — COMPREHENSIVE METABOLIC PANEL
ALT: 45 U/L (ref 0–53)
AST: 33 U/L (ref 0–37)
Albumin: 4.6 g/dL (ref 3.5–5.2)
Alkaline Phosphatase: 55 U/L (ref 39–117)
BUN: 11 mg/dL (ref 6–23)
CO2: 29 mEq/L (ref 19–32)
Calcium: 10 mg/dL (ref 8.4–10.5)
Chloride: 100 mEq/L (ref 96–112)
Creatinine, Ser: 1.26 mg/dL (ref 0.40–1.50)
GFR: 78.49 mL/min (ref >60.00)
Glucose, Bld: 89 mg/dL (ref 70–99)
Potassium: 4.9 mEq/L (ref 3.5–5.1)
Sodium: 136 mEq/L (ref 135–145)
Total Bilirubin: 0.9 mg/dL (ref 0.2–1.2)
Total Protein: 8.3 g/dL (ref 6.0–8.3)

## 2021-02-21 LAB — URINALYSIS, ROUTINE W REFLEX MICROSCOPIC
Bilirubin Urine: NEGATIVE
Ketones, ur: NEGATIVE
Nitrite: NEGATIVE
Specific Gravity, Urine: 1.02 (ref 1.000–1.030)
Urine Glucose: NEGATIVE
Urobilinogen, UA: 2 — AB (ref 0.0–1.0)
pH: 8 (ref 5.0–8.0)

## 2021-02-21 LAB — TSH: TSH: 2.22 u[IU]/mL (ref 0.35–5.50)

## 2021-02-21 MED ORDER — LORATADINE 10 MG PO TABS: 10.0000 mg | ORAL_TABLET | Freq: Every day | ORAL | 11 refills | Status: DC

## 2021-02-21 MED ORDER — FLUTICASONE PROPIONATE 50 MCG/ACT NA SUSP: 2.0000 | Freq: Every day | NASAL | 6 refills | Status: DC

## 2021-02-21 MED ORDER — LORATADINE-PSEUDOEPHEDRINE ER 10-240 MG PO TB24
1.0000 | ORAL_TABLET | Freq: Every day | ORAL | 3 refills | Status: DC | PRN
Start: 2021-02-21 — End: 2021-11-08

## 2021-02-21 MED ORDER — DOXYCYCLINE HYCLATE 100 MG PO TABS: 100.0000 mg | ORAL_TABLET | Freq: Two times a day (BID) | ORAL | 0 refills | Status: DC

## 2021-02-21 MED ORDER — CIPROFLOXACIN HCL 500 MG PO TABS: 500.0000 mg | ORAL_TABLET | Freq: Two times a day (BID) | ORAL | 0 refills | Status: DC

## 2021-02-21 MED ORDER — VITAMIN D3 50 MCG (2000 UT) PO CAPS: 2000.0000 [IU] | ORAL_CAPSULE | Freq: Every day | ORAL | 3 refills | Status: DC

## 2021-02-21 NOTE — Assessment & Plan Note (Addendum)
New.  Start on doxycycline

## 2021-02-21 NOTE — Telephone Encounter (Signed)
This is correct.  Cipro 1 tablet once.  Doxycycline 1 twice a day for 10 days.  Thanks

## 2021-02-21 NOTE — Assessment & Plan Note (Signed)
Claritin

## 2021-02-21 NOTE — Progress Notes (Signed)
Subjective:  Patient ID: Terry West, male    DOB: [birth year]  Age: 25 y.o. MRN: [id number]  CC: Annual Exam   HPI Terry West presents for a well exam C/o URI symptoms-nasal congestion, mild sore throat She is complaining of possible STD exposure, currently having some penile discharge and burning on urination     Outpatient Medications Prior to Visit  Medication Sig Dispense Refill   ibuprofen (ADVIL) 200 MG tablet Take 200-400 mg by mouth every 6 (six) hours as needed.     acetaminophen (TYLENOL) 325 MG tablet Take 650 mg by mouth every 6 (six) hours as needed for moderate pain. (Patient not taking: Reported on 02/21/2021)     albuterol (VENTOLIN HFA) 108 (90 Base) MCG/ACT inhaler Inhale 1-2 puffs into the lungs every 6 (six) hours as needed for wheezing or shortness of breath. (Patient not taking: Reported on 02/21/2021) 18 g 0   b complex vitamins tablet Take 1 tablet by mouth daily. (Patient not taking: No sig reported) 100 tablet 3   benzonatate (TESSALON) 100 MG capsule Take 1-2 capsules (100-200 mg total) by mouth 3 (three) times daily as needed for cough. (Patient not taking: Reported on 02/21/2021) 60 capsule 0   cetirizine (ZYRTEC ALLERGY) 10 MG tablet Take 1 tablet (10 mg total) by mouth daily. (Patient not taking: Reported on 02/21/2021) 30 tablet 0   doxycycline (VIBRA-TABS) 100 MG tablet Take 1 tablet (100 mg total) by mouth 2 (two) times daily. (Patient not taking: Reported on 02/21/2021) 14 tablet 0   fexofenadine (ALLEGRA ALLERGY) 180 MG tablet Take 1 tablet (180 mg total) by mouth daily. (Patient not taking: No sig reported) 90 tablet 3   promethazine-dextromethorphan (PROMETHAZINE-DM) 6.25-15 MG/5ML syrup Take 5 mLs by mouth at bedtime as needed for cough. (Patient not taking: Reported on 02/21/2021) 100 mL 0   pseudoephedrine (SUDAFED) 60 MG tablet Take 1 tablet (60 mg total) by mouth every 8 (eight) hours as needed for congestion. (Patient not taking:  Reported on 02/21/2021) 30 tablet 0   No facility-administered medications prior to visit.    ROS: Review of Systems  Constitutional:  Negative for appetite change, fatigue and unexpected weight change.  HENT:  Positive for congestion. Negative for nosebleeds, sneezing, sore throat and trouble swallowing.   Eyes:  Negative for itching and visual disturbance.  Respiratory:  Positive for cough.   Cardiovascular:  Negative for chest pain, palpitations and leg swelling.  Gastrointestinal:  Negative for abdominal distention, blood in stool, diarrhea and nausea.  Genitourinary:  Positive for dysuria. Negative for flank pain, frequency, genital sores, hematuria and testicular pain.  Musculoskeletal:  Negative for back pain, gait problem, joint swelling and neck pain.  Skin:  Negative for rash.  Neurological:  Negative for dizziness, tremors, speech difficulty and weakness.  Psychiatric/Behavioral:  Negative for agitation, dysphoric mood and sleep disturbance. The patient is not nervous/anxious.    Objective:  BP 118/70 (BP Location: Left Arm)   Pulse 98   Temp 99.6 F (37.6 C) (Oral)   Ht 6\' 1"  (1.854 m)   Wt 193 lb 12.8 oz (87.9 kg)   SpO2 97%   BMI 25.57 kg/m   BP Readings from Last 3 Encounters:  02/21/21 118/70  06/21/20 140/67  06/20/20 127/83    Wt Readings from Last 3 Encounters:  02/21/21 193 lb 12.8 oz (87.9 kg)  10/31/18 176 lb (79.8 kg)  06/28/18 175 lb (79.4 kg)    Physical Exam Constitutional:  General: He is not in acute distress.    Appearance: Normal appearance. He is well-developed.     Comments: NAD  Eyes:     Conjunctiva/sclera: Conjunctivae normal.     Pupils: Pupils are equal, round, and reactive to light.  Neck:     Thyroid: No thyromegaly.     Vascular: No JVD.  Cardiovascular:     Rate and Rhythm: Normal rate and regular rhythm.     Heart sounds: Normal heart sounds. No murmur heard.   No friction rub. No gallop.  Pulmonary:     Effort:  Pulmonary effort is normal. No respiratory distress.     Breath sounds: Normal breath sounds. No wheezing or rales.  Chest:     Chest wall: No tenderness.  Abdominal:     General: Bowel sounds are normal. There is no distension.     Palpations: Abdomen is soft. There is no mass.     Tenderness: There is no abdominal tenderness. There is no guarding or rebound.  Genitourinary:    Penis: Normal.      Testes: Normal.  Musculoskeletal:        General: No tenderness. Normal range of motion.     Cervical back: Normal range of motion.  Lymphadenopathy:     Cervical: No cervical adenopathy.  Skin:    General: Skin is warm and dry.     Findings: No rash.  Neurological:     Mental Status: He is alert and oriented to person, place, and time.     Cranial Nerves: No cranial nerve deficit.     Motor: No abnormal muscle tone.     Coordination: Coordination normal.     Gait: Gait normal.     Deep Tendon Reflexes: Reflexes are normal and symmetric.  Psychiatric:        Behavior: Behavior normal.        Thought Content: Thought content normal.        Judgment: Judgment normal.    I spent 22 minutes in addition to time for CPX wellness examination in preparing to see the patient by reviewing separately obtained history, communicating with the patient, ordering medications, tests or procedures, and documenting clinical information in the EHR including the differential diagnosis, treatment, and any further evaluation and other management of STD prophylaxis, treatment, safe sex and other related issues including informing his partner or partners of the STD fact.         Lab Results  Component Value Date   WBC 7.6 02/21/2021   HGB 15.3 02/21/2021   HCT 45.3 02/21/2021   PLT 209.0 02/21/2021   GLUCOSE 89 02/21/2021   CHOL 159 02/21/2021   TRIG 71.0 02/21/2021   HDL 37.20 (L) 02/21/2021   LDLCALC 108 (H) 02/21/2021   ALT 45 02/21/2021   AST 33 02/21/2021   NA 136 02/21/2021   K 4.9  02/21/2021   CL 100 02/21/2021   CREATININE 1.26 02/21/2021   BUN 11 02/21/2021   CO2 29 02/21/2021   TSH 2.22 02/21/2021    DG Mandible 1-3 Views  Result Date: 06/21/2020 CLINICAL DATA:  Fall downstairs.  Facial pain EXAM: MANDIBLE - 1-3 VIEW COMPARISON:  None. FINDINGS: No gross evidence mandibular fracture. RIGHT projection is partially obscured by soft tissues of the shoulders. Mandibular condyles appear located. IMPRESSION: No radiographic evidence of fracture. Some limitation related to positioning. Electronically Signed   By: Genevive Bi M.D.   On: 06/21/2020 16:33    Assessment & Plan:  Problem List Items Addressed This Visit     Asthma    Claritin      Patient exposure to body fluids    New.  We discussed STD prophylaxis, treatment, safe sex and other related issues including informing his partner or partners of the STD fact. I prescribed Cipro 1 dose and doxycycline.  GC/chlamydia urine, RPR, HIV ordered      URI (upper respiratory infection)    New.  Start on doxycycline      Well adult exam - Primary    We discussed age appropriate health related issues, including available/recomended screening tests and vaccinations. Labs were ordered to be later reviewed . All questions were answered. We discussed one or more of the following - seat belt use, use of sunscreen/sun exposure exercise, fall risk reduction, second hand smoke exposure, firearm use and storage, seat belt use, a need for adhering to healthy diet and exercise. Labs were ordered.  All questions were answered. Take Vit D Gardasil advised      Relevant Orders   TSH (Completed)   Urinalysis   CBC with Differential/Platelet (Completed)   Lipid panel (Completed)   Comprehensive metabolic panel (Completed)   GC/Chlamydia Probe Amp (Completed)   HIV Antibody (routine testing w rflx) (Completed)   Other Visit Diagnoses     Exposure to STD       Relevant Orders   GC/Chlamydia Probe Amp (Completed)    HIV Antibody (routine testing w rflx) (Completed)         Meds ordered this encounter  Medications   Cholecalciferol (VITAMIN D3) 50 MCG (2000 UT) capsule    Sig: Take 1 capsule (2,000 Units total) by mouth daily.    Dispense:  100 capsule    Refill:  3   loratadine (CLARITIN) 10 MG tablet    Sig: Take 1 tablet (10 mg total) by mouth daily.    Dispense:  30 tablet    Refill:  11   loratadine-pseudoephedrine (CLARITIN-D 24-HOUR) 10-240 MG 24 hr tablet    Sig: Take 1 tablet by mouth daily as needed for allergies.    Dispense:  30 tablet    Refill:  3   fluticasone (FLONASE) 50 MCG/ACT nasal spray    Sig: Place 2 sprays into both nostrils daily.    Dispense:  16 g    Refill:  6   ciprofloxacin (CIPRO) 500 MG tablet    Sig: Take 1 tablet (500 mg total) by mouth 2 (two) times daily.    Dispense:  1 tablet    Refill:  0   doxycycline (VIBRA-TABS) 100 MG tablet    Sig: Take 1 tablet (100 mg total) by mouth 2 (two) times daily.    Dispense:  20 tablet    Refill:  0       Follow-up: Return in about 1 year (around 02/21/2022) for Wellness Exam.  Walker Kehr, MD

## 2021-02-21 NOTE — Assessment & Plan Note (Addendum)
We discussed age appropriate health related issues, including available/recomended screening tests and vaccinations. Labs were ordered to be later reviewed . All questions were answered. We discussed one or more of the following - seat belt use, use of sunscreen/sun exposure exercise, fall risk reduction, second hand smoke exposure, firearm use and storage, seat belt use, a need for adhering to healthy diet and exercise. Labs were ordered.  All questions were answered. Take Vit D Gardasil advised

## 2021-02-21 NOTE — Telephone Encounter (Signed)
Patient called because ciprofloxacin (CIPRO) 500 MG tablet script written for today is for 1 tablet,  [id number] - [place], [place] - [address] AT [facility] OF [address] Phone:  [phone]  Fax:  [phone]     Please update with the pharmacy

## 2021-02-21 NOTE — Patient Instructions (Signed)
Human Papillomavirus Quadrivalent Vaccine suspension for injection ?What is this medication? ?HUMAN PAPILLOMAVIRUS VACCINE (HYOO muhn  pap uh LOH muh  vahy ruhs  vak SEEN) is a vaccine. It is used to prevent infections of four types of the human papillomavirus. In women, the vaccine may lower your risk of getting cervical, vaginal, vulvar, or anal cancer and genital warts. In men, the vaccine may lower your risk of getting genital warts and anal cancer. You cannot get these diseases from the vaccine. This vaccine does not treat these diseases. ?This medicine may be used for other purposes; ask your health care provider or pharmacist if you have questions. ?COMMON BRAND NAME(S): Gardasil ?What should I tell my care team before I take this medication? ?They need to know if you have any of these conditions: ?fever or infection ?hemophilia ?HIV infection or AIDS ?immune system problems ?low platelet count ?an unusual reaction to Human Papillomavirus Vaccine, yeast, other medicines, foods, dyes, or preservatives ?pregnant or trying to get pregnant ?breast-feeding ?How should I use this medication? ?This vaccine is for injection in a muscle on your upper arm or thigh. It is given by a health care professional. You will be observed for 15 minutes after each dose. Sometimes, fainting happens after the vaccine is given. You may be asked to sit or lie down during the 15 minutes. Three doses are given. The second dose is given 2 months after the first dose. The last dose is given 4 months after the second dose. ?A copy of a Vaccine Information Statement will be given before each vaccination. Read this sheet carefully each time. The sheet may change frequently. ?Talk to your pediatrician regarding the use of this medicine in children. While this drug may be prescribed for children as young as 9 years of age for selected conditions, precautions do apply. ?Overdosage: If you think you have taken too much of this medicine contact  a poison control center or emergency room at once. ?NOTE: This medicine is only for you. Do not share this medicine with others. ?What if I miss a dose? ?All 3 doses of the vaccine should be given within 6 months. Remember to keep appointments for follow-up doses. Your health care provider will tell you when to return for the next vaccine. Ask your health care professional for advice if you are unable to keep an appointment or miss a scheduled dose. ?What may interact with this medication? ?other vaccines ?This list may not describe all possible interactions. Give your health care provider a list of all the medicines, herbs, non-prescription drugs, or dietary supplements you use. Also tell them if you smoke, drink alcohol, or use illegal drugs. Some items may interact with your medicine. ?What should I watch for while using this medication? ?This vaccine may not fully protect everyone. Continue to have regular pelvic exams and cervical or anal cancer screenings as directed by your doctor. ?The Human Papillomavirus is a sexually transmitted disease. It can be passed by any kind of sexual activity that involves genital contact. The vaccine works best when given before you have any contact with the virus. Many people who have the virus do not have any signs or symptoms. ?Tell your doctor or health care professional if you have any reaction or unusual symptom after getting the vaccine. ?What side effects may I notice from receiving this medication? ?Side effects that you should report to your doctor or health care professional as soon as possible: ?allergic reactions like skin rash, itching or   hives, swelling of the face, lips, or tongue ?breathing problems ?feeling faint or lightheaded, falls ?Side effects that usually do not require medical attention (report to your doctor or health care professional if they continue or are bothersome): ?cough ?dizziness ?fever ?headache ?nausea ?redness, warmth, swelling, pain, or  itching at site where injected ?This list may not describe all possible side effects. Call your doctor for medical advice about side effects. You may report side effects to FDA at 1-800-FDA-1088. ?Where should I keep my medication? ?This drug is given in a hospital or clinic and will not be stored at home. ?NOTE: This sheet is a summary. It may not cover all possible information. If you have questions about this medicine, talk to your doctor, pharmacist, or health care provider. ?? 2022 Elsevier/Gold Standard (2013-05-19 00:00:00) ? ?

## 2021-02-22 LAB — HIV ANTIBODY (ROUTINE TESTING W REFLEX): HIV 1&2 Ab, 4th Generation: NONREACTIVE

## 2021-02-22 NOTE — Telephone Encounter (Signed)
Patient calling in  Says he went to pick up rx ciprofloxacin (CIPRO) 500 MG tablet from pharmacy & they advised him that the rx had been closed  Please confirm w/ pharmacy & let patient know [phone]

## 2021-02-22 NOTE — Telephone Encounter (Signed)
Notified pt w/MD response.../lmb 

## 2021-02-22 NOTE — Telephone Encounter (Signed)
Notified pt called pharmacy and they are filling rx...Raechel Chute

## 2021-02-22 NOTE — Telephone Encounter (Signed)
Tried calling pt there was no answer can't leave msg due to vm being full.Marland KitchenRaechel West

## 2021-02-23 LAB — GC/CHLAMYDIA PROBE AMP
Chlamydia trachomatis, NAA: NEGATIVE
Neisseria Gonorrhoeae by PCR: POSITIVE — AB

## 2021-02-27 NOTE — Assessment & Plan Note (Signed)
New.  We discussed STD prophylaxis, treatment, safe sex and other related issues including informing his partner or partners of the STD fact. I prescribed Cipro 1 dose and doxycycline.  GC/chlamydia urine, RPR, HIV ordered

## 2021-03-01 ENCOUNTER — Telehealth: Payer: Self-pay | Admitting: Internal Medicine

## 2021-03-01 NOTE — Telephone Encounter (Signed)
Terry West from [facility] called and is requesting call back from assistant to discuss treatment for gonorrhea.      Callback #- [phone]

## 2021-03-01 NOTE — Telephone Encounter (Signed)
Was able to contact pt made nurse visit for tomorrow 8:20. Will forward to MD for FYI...Raechel Chute

## 2021-03-01 NOTE — Telephone Encounter (Signed)
Ephraim Hamburger, RN back she wanted to double check what MD rx pt last week. Per chart MD rx Cipro & Doxycycline. Brandy states Cipro is not recommended for treatment of Tonye Pearson that the pt actual need to get Rocephin 500 mg shot, and to let MD know up to date guideline. Inform Brandy, RN will reach put to pt to get him schedule for rocephrin.

## 2021-03-02 ENCOUNTER — Other Ambulatory Visit: Payer: Self-pay

## 2021-03-02 ENCOUNTER — Ambulatory Visit (INDEPENDENT_AMBULATORY_CARE_PROVIDER_SITE_OTHER): Payer: No Typology Code available for payment source

## 2021-03-02 DIAGNOSIS — A549 Gonococcal infection, unspecified: Secondary | ICD-10-CM | POA: Diagnosis not present

## 2021-03-02 MED ORDER — CEFTRIAXONE SODIUM 500 MG IJ SOLR
500.0000 mg | Freq: Once | INTRAMUSCULAR | Status: AC
  Administered 2021-03-02: 500 mg via INTRAMUSCULAR

## 2021-03-02 NOTE — Progress Notes (Signed)
Pt given 500mg  of cefTRIAXone (ROCEPHIN) w/o any complications.

## 2021-03-06 NOTE — Telephone Encounter (Signed)
Ok to come in for Rocephin 500 mg IM once Thx

## 2021-03-07 NOTE — Telephone Encounter (Signed)
Pt already came for Rocephin on 11/23.Marland KitchenRaechel West

## 2021-03-29 ENCOUNTER — Encounter: Payer: No Typology Code available for payment source | Admitting: Internal Medicine

## 2021-05-15 ENCOUNTER — Encounter (HOSPITAL_COMMUNITY): Payer: Self-pay | Admitting: *Deleted

## 2021-05-15 ENCOUNTER — Ambulatory Visit (HOSPITAL_COMMUNITY)
Admission: EM | Admit: 2021-05-15 | Discharge: 2021-05-15 | Disposition: A | Discharge status: Home / Self Care | Payer: No Typology Code available for payment source | Attending: Family Medicine | Admitting: Family Medicine

## 2021-05-15 ENCOUNTER — Other Ambulatory Visit: Payer: Self-pay

## 2021-05-15 ENCOUNTER — Telehealth (HOSPITAL_COMMUNITY): Payer: Self-pay | Admitting: Emergency Medicine

## 2021-05-15 DIAGNOSIS — J45909 Unspecified asthma, uncomplicated: Secondary | ICD-10-CM | POA: Insufficient documentation

## 2021-05-15 DIAGNOSIS — R0789 Other chest pain: Secondary | ICD-10-CM | POA: Insufficient documentation

## 2021-05-15 DIAGNOSIS — R202 Paresthesia of skin: Secondary | ICD-10-CM | POA: Insufficient documentation

## 2021-05-15 DIAGNOSIS — Z20822 Contact with and (suspected) exposure to covid-19: Secondary | ICD-10-CM | POA: Insufficient documentation

## 2021-05-15 MED ORDER — ALBUTEROL SULFATE HFA 108 (90 BASE) MCG/ACT IN AERS: 1.0000 | INHALATION_SPRAY | Freq: Four times a day (QID) | RESPIRATORY_TRACT | 0 refills | Status: DC | PRN

## 2021-05-15 NOTE — Discharge Instructions (Signed)
Use the albuterol as needed for cough, wheezing, shortness of breath.  Continue the benadryl, claritin

## 2021-05-15 NOTE — ED Triage Notes (Signed)
C/O yesterday "feeling like I was having an allergic reaction" - with sore throat and tingling in face - took Sudafed, Claritin, and Benadryl with resolution of tingling, but continues with sore throat. C/O painful swallowing and talking. C/O body aches, cough, some congestion, C/O left chest pain "when I take a deep breath, or when I breathe in and out".

## 2021-05-15 NOTE — Telephone Encounter (Signed)
Pt requested medications go to a different pharmacy. Rx sent to pharmacy.

## 2021-05-15 NOTE — ED Provider Notes (Signed)
[facility]    CSN: [id number] Arrival date & time: 05/15/21  1541      History   Chief Complaint Chief Complaint  Patient presents with   Cough   Chest Pain    HPI Terry West is a 26 y.o. male.   26 year old male that reports for what he feels like may be allergic reaction. Started yesterday with weird tingling in the face and funny sensation in the throat after wearing cloth mask.  Was concerned because he had some tightness in the chest today despite taking Benadryl, Claritin. No SOB. No cough, fever, chills, body aches.    Cough Associated symptoms: chest pain   Chest Pain Associated symptoms: cough    Past Medical History:  Diagnosis Date   ALLERGIC RHINITIS 07/30/2008   Qualifier: Diagnosis of  By: Posey Rea MD, Georgina Quint    Anxiety    ASTHMA 07/30/2008   Qualifier: Diagnosis of  By: Posey Rea MD, Georgina Quint    GERD 11/30/2008   Qualifier: Diagnosis of  By: Posey Rea MD, Georgina Quint    SCOLIOSIS 10/04/2009   LS spine -- to R      Patient Active Problem List   Diagnosis Date Noted   Acute pain of right knee 05/31/2017   Chronic left shoulder pain 05/31/2017   Post concussion syndrome 05/31/2017   Erectile dysfunction 11/18/2015   URI (upper respiratory infection) 08/27/2015   Patient exposure to body fluids 01/06/2015   Pes planus 06/20/2013   Left wrist pain 06/06/2013   Pain in joint, ankle and foot 06/06/2013   LBP (low back pain) 06/06/2013   Well adult exam 11/27/2012   Headache 11/27/2012   Contusion of wrist, left 11/27/2012   Dyspepsia 09/27/2012   Unspecified constipation 09/27/2012   Testicle lump 09/27/2012   Acute thoracic back pain 09/19/2011   Anxiety disorder 09/19/2011   Epistaxis 09/19/2011   SCOLIOSIS 10/04/2009   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 Authorizing Provider  loratadine (CLARITIN) 10 MG tablet Take 1 tablet (10 mg total) by mouth daily. 02/21/21  Yes Plotnikov, Georgina Quint, MD  albuterol (VENTOLIN HFA) 108 (90 Base) MCG/ACT inhaler Inhale 1-2 puffs into the lungs every 6 (six) hours as needed for wheezing or shortness of breath. 05/15/21   Dahlia Byes A, NP  Cholecalciferol (VITAMIN D3) 50 MCG (2000 UT) capsule Take 1 capsule (2,000 Units total) by mouth daily. 02/21/21   Plotnikov, Georgina Quint, MD  ciprofloxacin (CIPRO) 500 MG tablet Take 1 tablet (500 mg total) by mouth 2 (two) times daily. 02/21/21   Plotnikov, Georgina Quint, MD  doxycycline (VIBRA-TABS) 100 MG tablet Take 1 tablet (100 mg total) by mouth 2 (two) times daily. 02/21/21   Plotnikov, Georgina Quint, MD  fluticasone (FLONASE) 50 MCG/ACT nasal spray Place 2 sprays into both nostrils daily. 02/21/21   Plotnikov, Georgina Quint, MD  ibuprofen (ADVIL) 200 MG tablet Take 200-400 mg by mouth every 6 (six) hours as needed.    [provider]  loratadine-pseudoephedrine (CLARITIN-D 24-HOUR) 10-240 MG 24 hr tablet Take 1 tablet by mouth daily as needed for allergies. 02/21/21   Plotnikov, Georgina Quint, MD  Family History Family History  Problem Relation Age of Onset   Thyroid disease Mother    Heart disease Mother    Healthy Father     Social History Social History   Tobacco Use   Smoking status: Never   Smokeless tobacco: Never  Vaping Use   Vaping Use: Never used  Substance Use Topics   Alcohol use: Yes    Comment: occasionally   Drug use: No     Allergies   Patient has no known allergies.   Review of Systems Review of Systems  Respiratory:  Positive for cough.   Cardiovascular:  Positive for chest pain.    Physical Exam Triage Vital Signs ED Triage Vitals [05/15/21 1549]  Enc Vitals Group     BP (!) 147/78     Pulse Rate 100      Resp 18     Temp 98.2 F (36.8 C)     Temp Source Oral     SpO2 100 %     Weight      Height      Head Circumference      Peak Flow      Pain Score 6     Pain Loc      Pain Edu?      Excl. in GC?    No data found.  Updated Vital Signs BP (!) 147/78    Pulse 100    Temp 98.2 F (36.8 C) (Oral)    Resp 18    SpO2 100%   Visual Acuity Right Eye Distance:   Left Eye Distance:   Bilateral Distance:    Right Eye Near:   Left Eye Near:    Bilateral Near:     Physical Exam Vitals and nursing note reviewed.  Constitutional:      General: He is not in acute distress.    Appearance: He is well-developed. He is not ill-appearing, toxic-appearing or diaphoretic.  HENT:     Mouth/Throat:     Pharynx: Oropharynx is clear. Uvula midline.  Cardiovascular:     Rate and Rhythm: Normal rate and regular rhythm.     Heart sounds: Normal heart sounds.  Pulmonary:     Breath sounds: Normal breath sounds.  Skin:    General: Skin is warm and dry.  Neurological:     Mental Status: He is alert.     UC Treatments / Results  Labs (all labs ordered are listed, but only abnormal results are displayed) Labs Reviewed  SARS CORONAVIRUS 2 (TAT 6-24 HRS)    EKG   Radiology No results found.  Procedures Procedures (including critical care time)  Medications Ordered in UC Medications - No data to display  Initial Impression / Assessment and Plan / UC Course  I have reviewed the triage vital signs and the nursing notes.  Pertinent labs & imaging results that were available during my care of the patient were reviewed by me and considered in my medical decision making (see chart for details).     Allergic reaction- Continue the antihistamines.  No acute concerns on exam  Albuterol as needed  F/U as needed  Final Clinical Impressions(s) / UC Diagnoses   Final diagnoses:  Chest tightness     Discharge Instructions      Use the albuterol as needed for cough, wheezing,  shortness of breath.  Continue the benadryl, claritin    ED Prescriptions     Medication Sig Dispense Auth. Provider   albuterol (VENTOLIN HFA)  108 (90 Base) MCG/ACT inhaler Inhale 1-2 puffs into the lungs every 6 (six) hours as needed for wheezing or shortness of breath. 1 each Janace Aris, NP      PDMP not reviewed this encounter.   Janace Aris, NP 05/15/21 1752

## 2021-05-16 LAB — SARS CORONAVIRUS 2 (TAT 6-24 HRS): SARS Coronavirus 2: NEGATIVE

## 2021-11-08 ENCOUNTER — Ambulatory Visit (INDEPENDENT_AMBULATORY_CARE_PROVIDER_SITE_OTHER): Payer: Self-pay

## 2021-11-08 ENCOUNTER — Encounter (HOSPITAL_COMMUNITY): Payer: Self-pay | Admitting: Emergency Medicine

## 2021-11-08 ENCOUNTER — Ambulatory Visit (HOSPITAL_COMMUNITY)
Admission: EM | Admit: 2021-11-08 | Discharge: 2021-11-08 | Disposition: A | Discharge status: Home / Self Care | Payer: Self-pay | Attending: Family Medicine | Admitting: Family Medicine

## 2021-11-08 DIAGNOSIS — W19XXXA Unspecified fall, initial encounter: Secondary | ICD-10-CM

## 2021-11-08 DIAGNOSIS — M79671 Pain in right foot: Secondary | ICD-10-CM

## 2021-11-08 MED ORDER — KETOROLAC TROMETHAMINE 30 MG/ML IJ SOLN
INTRAMUSCULAR | Status: AC
  Filled 2021-11-08: qty 1

## 2021-11-08 MED ORDER — KETOROLAC TROMETHAMINE 30 MG/ML IJ SOLN
30.0000 mg | Freq: Once | INTRAMUSCULAR | Status: AC
  Administered 2021-11-08: 30 mg via INTRAMUSCULAR

## 2021-11-08 MED ORDER — IBUPROFEN 800 MG PO TABS: 800.0000 mg | ORAL_TABLET | Freq: Three times a day (TID) | ORAL | 0 refills | Status: DC | PRN

## 2021-11-08 NOTE — ED Provider Notes (Signed)
[facility]    CSN: [id number] Arrival date & time: 11/08/21  1720      History   Chief Complaint Chief Complaint  Patient presents with   Foot Pain    HPI Terry West is a 26 y.o. male.    Foot Pain   Here for right foot pain.  Today his flip-flop got caught on a step and he fell forward onto his right foot.  There is now pain around the distal foot on the medial part around his first second and third MTP joints.  There is some swelling there.  It hurts to flex and extend his toes.  He did not hit his head  Past Medical History:  Diagnosis Date   ALLERGIC RHINITIS 07/30/2008   Qualifier: Diagnosis of  By: Posey Rea MD, Georgina Quint    Anxiety    ASTHMA 07/30/2008   Qualifier: Diagnosis of  By: Posey Rea MD, Georgina Quint    GERD 11/30/2008   Qualifier: Diagnosis of  By: Posey Rea MD, Georgina Quint    SCOLIOSIS 10/04/2009   LS spine -- to R      Patient Active Problem List   Diagnosis Date Noted   Acute pain of right knee 05/31/2017   Chronic left shoulder pain 05/31/2017   Post concussion syndrome 05/31/2017   Erectile dysfunction 11/18/2015   URI (upper respiratory infection) 08/27/2015   Patient exposure to body fluids 01/06/2015   Pes planus 06/20/2013   Left wrist pain 06/06/2013   Pain in joint, ankle and foot 06/06/2013   LBP (low back pain) 06/06/2013   Well adult exam 11/27/2012   Headache 11/27/2012   Contusion of wrist, left 11/27/2012   Dyspepsia 09/27/2012   Unspecified constipation 09/27/2012   Testicle lump 09/27/2012   Acute thoracic back pain 09/19/2011   Anxiety disorder 09/19/2011   Epistaxis 09/19/2011   SCOLIOSIS 10/04/2009   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 Authorizing Provider  ibuprofen (ADVIL) 800 MG tablet Take 1 tablet (800 mg total) by mouth every 8 (eight) hours as needed (pain). 11/08/21  Yes Zenia Resides, MD  albuterol (VENTOLIN HFA) 108 (90 Base) MCG/ACT inhaler Inhale 1-2 puffs into the lungs every 6 (six) hours as needed for wheezing or shortness of breath. 05/15/21   Janace Aris, NP    Family History Family History  Problem Relation Age of Onset   Thyroid disease Mother    Heart disease Mother    Healthy Father     Social History Social History   Tobacco Use   Smoking status: Never   Smokeless tobacco: Never  Vaping Use   Vaping Use: Never used  Substance Use Topics   Alcohol use: Yes    Comment: occasionally   Drug use: No     Allergies   Patient has no known allergies.   Review of Systems Review of Systems   Physical Exam Triage Vital Signs ED Triage Vitals  Enc Vitals Group     BP 11/08/21 1742 117/68     Pulse Rate 11/08/21 1742 64     Resp 11/08/21 1742 14     Temp 11/08/21 1742 98.8 F (  37.1 C)     Temp Source 11/08/21 1742 Oral     SpO2 11/08/21 1742 98 %     Weight --      Height --      Head Circumference --      Peak Flow --      Pain Score 11/08/21 1741 9     Pain Loc --      Pain Edu? --      Excl. in GC? --    No data found.  Updated Vital Signs BP 117/68 (BP Location: Right Arm)   Pulse 64   Temp 98.8 F (37.1 C) (Oral)   Resp 14   SpO2 98%   Visual Acuity Right Eye Distance:   Left Eye Distance:   Bilateral Distance:    Right Eye Near:   Left Eye Near:    Bilateral Near:     Physical Exam Vitals reviewed.  Constitutional:      General: He is not in acute distress.    Appearance: He is not ill-appearing, toxic-appearing or diaphoretic.  Musculoskeletal:     Comments: There is some tenderness and swelling around the distal foot just proximal to the MTP joints and over the first through third MTP joints the toes are nontender that I  can tell  Neurological:     Mental Status: He is alert and oriented to person, place, and time.  Psychiatric:        Behavior: Behavior normal.      UC Treatments / Results  Labs (all labs ordered are listed, but only abnormal results are displayed) Labs Reviewed - No data to display  EKG   Radiology DG Foot Complete Right  Result Date: 11/08/2021 CLINICAL DATA:  Distal foot pain, fall EXAM: RIGHT FOOT COMPLETE - 3+ VIEW COMPARISON:  None Available. FINDINGS: There is no evidence of fracture or dislocation. There is no evidence of arthropathy or other focal bone abnormality. Soft tissues are unremarkable. IMPRESSION: Negative. Electronically Signed   By: Jasmine Pang M.D.   On: 11/08/2021 18:18    Procedures Procedures (including critical care time)  Medications Ordered in UC Medications  ketorolac (TORADOL) 30 MG/ML injection 30 mg (has no administration in time range)    Initial Impression / Assessment and Plan / UC Course  I have reviewed the triage vital signs and the nursing notes.  Pertinent labs & imaging results that were available during my care of the patient were reviewed by me and considered in my medical decision making (see chart for details).     Xrays do not show any fractures.  Toradol was given today and he will take ibuprofen prescription at home.  Ace wrap is provided to add some compression and he will ice and elevate it at home. Final Clinical Impressions(s) / UC Diagnoses   Final diagnoses:  Foot pain, right     Discharge Instructions      There are no broken bones on your x-rays  You have been given a shot of Toradol 30 mg today.  Take ibuprofen 800 mg--1 tab every 8 hours as needed for pain.  Ice and elevate your foot.     ED Prescriptions     Medication Sig Dispense Auth. Provider   ibuprofen (ADVIL) 800 MG tablet Take 1 tablet (800 mg total) by mouth every 8 (eight) hours as needed (pain). 21 tablet Sherill Wegener, Janace Aris, MD       PDMP not reviewed this encounter.   Loreta Ave  K, MD 11/08/21 6203

## 2021-11-08 NOTE — Discharge Instructions (Addendum)
There are no broken bones on your x-rays  You have been given a shot of Toradol 30 mg today.  Take ibuprofen 800 mg--1 tab every 8 hours as needed for pain.  Ice and elevate your foot.

## 2021-11-08 NOTE — ED Triage Notes (Signed)
Pt reports yesterday coming down back stairs sandal got caught on step causing to hit right foot on concrete. Pt reports tried to go to work but to painful. Pt reports pain worse with movement and weight bearing

## 2022-03-17 ENCOUNTER — Ambulatory Visit (HOSPITAL_COMMUNITY)
Admission: EM | Admit: 2022-03-17 | Discharge: 2022-03-17 | Disposition: A | Discharge status: Home / Self Care | Payer: Medicaid Other | Attending: Internal Medicine | Admitting: Internal Medicine

## 2022-03-17 ENCOUNTER — Encounter (HOSPITAL_COMMUNITY): Payer: Self-pay

## 2022-03-17 DIAGNOSIS — J069 Acute upper respiratory infection, unspecified: Secondary | ICD-10-CM | POA: Diagnosis not present

## 2022-03-17 DIAGNOSIS — J028 Acute pharyngitis due to other specified organisms: Secondary | ICD-10-CM | POA: Diagnosis not present

## 2022-03-17 DIAGNOSIS — B9789 Other viral agents as the cause of diseases classified elsewhere: Secondary | ICD-10-CM | POA: Diagnosis not present

## 2022-03-17 DIAGNOSIS — R0981 Nasal congestion: Secondary | ICD-10-CM

## 2022-03-17 MED ORDER — BENZONATATE 100 MG PO CAPS: 100.0000 mg | ORAL_CAPSULE | Freq: Three times a day (TID) | ORAL | 0 refills | Status: DC

## 2022-03-17 MED ORDER — IBUPROFEN 800 MG PO TABS
800.0000 mg | ORAL_TABLET | Freq: Once | ORAL | Status: AC
  Administered 2022-03-17: 800 mg via ORAL

## 2022-03-17 MED ORDER — IBUPROFEN 800 MG PO TABS
ORAL_TABLET | ORAL | Status: AC
  Filled 2022-03-17: qty 1

## 2022-03-17 NOTE — Discharge Instructions (Signed)
You have a viral upper respiratory infection.   Purchase mucinex (guaifenesin) 1200mg and take this every 12 hours for the next few days to thin your nasal congestion and mucous so that you are able to get out of your body easier by coughing and blowing your nose. Drink plenty of water while taking this.  Take tessalon pearles every 8 hours as needed for cough.  You may take tylenol 1,000mg and ibuprofen 600mg every 6 hours with food as needed for fever/chills, sore throat, aches/pains, and inflammation associated with viral illness. Take this with food to avoid stomach upset.    You may do salt water and baking soda gargles every 4 hours as needed for your throat pain.  Please put 1 teaspoon of salt and 1/2 teaspoon of baking soda in 8 ounces of warm water then gargle and spit the water out. You may also put 1 tablespoon of honey in warm water and drink this to soothe your throat.  Place a humidifier in your room at night to help decrease dry air that can irritate your airway and cause you to have a sore throat and cough.  Please try to eat a well-balanced diet while you are sick so that your body gets proper nutrition to heal.  If you develop any new or worsening symptoms, please return.  If your symptoms are severe, please go to the emergency room.  Follow-up with your primary care provider for further evaluation and management of your symptoms as well as ongoing wellness visits.  I hope you feel better!   

## 2022-03-17 NOTE — ED Triage Notes (Signed)
Pt is here for SOB, bilateral ear pain, chills, fatigue , nasal congestion, runny nose x4days

## 2022-03-17 NOTE — ED Provider Notes (Signed)
[facility]    CSN: [id number] Arrival date & time: 03/17/22  1312      History   Chief Complaint Chief Complaint  Patient presents with   Sore Throat   Shortness of Breath   Otalgia    HPI Terry West is a 26 y.o. male.   Patient presents to urgent care for evaluation of cough, nasal congestion/drainage, sore throat, and generalized fatigue over the last 3 days.  Girlfriend is sick with similar symptoms.  Home COVID tests were negative this morning.  No other known sick contacts.  Main symptom is sore throat and patient feels as though there is something scratching the back of his throat.  Throat pain is currently a 6 on a scale of 0-10, no difficulty maintaining secretions, pain with swallowing, or decreased appetite.  Denies shortness of breath, chest pain, nausea, ear pain, headache, dizziness, abdominal pain, body aches, fever, and chills.  No dizziness or tinnitus.  History of asthma but states he "outgrew this".  Had an albuterol inhaler from an upper respiratory infection a few years ago and has been using this just to see if it helps but it has not helped very much with his throat pain or cough.  He has not heard himself wheeze with coughing.  Initially thought symptoms were related to allergic rhinitis, took a Zyrtec/other antihistamines without relief of symptoms.  Denies smoking and drug use.  Denies recent antibiotic or steroid use.   Sore Throat Associated symptoms include shortness of breath.  Shortness of Breath Associated symptoms: ear pain   Otalgia   Past Medical History:  Diagnosis Date   ALLERGIC RHINITIS 07/30/2008   Qualifier: Diagnosis of  By: Posey ReaPlotnikov MD, Georgina QuintAleksei V    Anxiety    ASTHMA 07/30/2008   Qualifier: Diagnosis of  By: Posey ReaPlotnikov MD, Georgina QuintAleksei V    GERD 11/30/2008   Qualifier: Diagnosis of  By: Posey ReaPlotnikov MD, Georgina QuintAleksei V    SCOLIOSIS 10/04/2009   LS spine -- to R      Patient Active Problem List   Diagnosis Date Noted   Acute pain  of right knee 05/31/2017   Chronic left shoulder pain 05/31/2017   Post concussion syndrome 05/31/2017   Erectile dysfunction 11/18/2015   URI (upper respiratory infection) 08/27/2015   Patient exposure to body fluids 01/06/2015   Pes planus 06/20/2013   Left wrist pain 06/06/2013   Pain in joint, ankle and foot 06/06/2013   LBP (low back pain) 06/06/2013   Well adult exam 11/27/2012   Headache 11/27/2012   Contusion of wrist, left 11/27/2012   Dyspepsia 09/27/2012   Unspecified constipation 09/27/2012   Testicle lump 09/27/2012   Acute thoracic back pain 09/19/2011   Anxiety disorder 09/19/2011   Epistaxis 09/19/2011   SCOLIOSIS 10/04/2009   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 Authorizing Provider  benzonatate (TESSALON) 100 MG capsule Take 1 capsule (100 mg total) by mouth every 8 (eight) hours. 03/17/22  Yes Carlisle BeersStanhope, Chayanne Speir M, FNP  albuterol (VENTOLIN HFA) 108 (90 Base) MCG/ACT inhaler Inhale 1-2 puffs into the lungs every 6 (six) hours as needed for wheezing or shortness of breath. 05/15/21  Bast, Traci A, NP  ibuprofen (ADVIL) 800 MG tablet Take 1 tablet (800 mg total) by mouth every 8 (eight) hours as needed (pain). 11/08/21   Zenia Resides, MD    Family History Family History  Problem Relation Age of Onset   Thyroid disease Mother    Heart disease Mother    Healthy Father     Social History Social History   Tobacco Use   Smoking status: Never   Smokeless tobacco: Never  Vaping Use   Vaping Use: Never used  Substance Use Topics   Alcohol use: Yes    Comment: occasionally   Drug use: No     Allergies   Patient has no known allergies.   Review of  Systems Review of Systems  HENT:  Positive for ear pain.   Respiratory:  Positive for shortness of breath.   Per HPI   Physical Exam Triage Vital Signs ED Triage Vitals  Enc Vitals Group     BP 03/17/22 1422 121/75     Pulse Rate 03/17/22 1422 68     Resp 03/17/22 1422 16     Temp 03/17/22 1422 98 F (36.7 C)     Temp Source 03/17/22 1422 Oral     SpO2 03/17/22 1422 95 %     Weight --      Height --      Head Circumference --      Peak Flow --      Pain Score 03/17/22 1421 6     Pain Loc --      Pain Edu? --      Excl. in GC? --    No data found.  Updated Vital Signs BP 121/75 (BP Location: Left Arm)   Pulse 68   Temp 98 F (36.7 C) (Oral)   Resp 16   SpO2 95%   Visual Acuity Right Eye Distance:   Left Eye Distance:   Bilateral Distance:    Right Eye Near:   Left Eye Near:    Bilateral Near:     Physical Exam Vitals and nursing note reviewed.  Constitutional:      Appearance: He is not ill-appearing or toxic-appearing.  HENT:     Head: Normocephalic and atraumatic.     Right Ear: Hearing, tympanic membrane, ear canal and external ear normal.     Left Ear: Hearing, tympanic membrane, ear canal and external ear normal.     Nose: Congestion present.     Mouth/Throat:     Lips: Pink.     Mouth: Mucous membranes are moist.     Pharynx: Posterior oropharyngeal erythema present.  Eyes:     General: Lids are normal. Vision grossly intact. Gaze aligned appropriately.        Right eye: No discharge.        Left eye: No discharge.     Extraocular Movements: Extraocular movements intact.     Conjunctiva/sclera: Conjunctivae normal.     Pupils: Pupils are equal, round, and reactive to light.  Cardiovascular:     Rate and Rhythm: Normal rate and regular rhythm.     Heart sounds: Normal heart sounds, S1 normal and S2 normal.  Pulmonary:     Effort: Pulmonary effort is normal. No respiratory distress.     Breath sounds: Normal breath sounds and air entry.   Abdominal:     General: Bowel sounds are normal.     Palpations: Abdomen is soft.     Tenderness: There is  no abdominal tenderness. There is no right CVA tenderness, left CVA tenderness or guarding.  Musculoskeletal:     Cervical back: Neck supple.  Lymphadenopathy:     Cervical: No cervical adenopathy.  Skin:    General: Skin is warm and dry.     Capillary Refill: Capillary refill takes less than 2 seconds.     Findings: No rash.  Neurological:     General: No focal deficit present.     Mental Status: He is alert and oriented to person, place, and time. Mental status is at baseline.     Cranial Nerves: No dysarthria or facial asymmetry.  Psychiatric:        Mood and Affect: Mood normal.        Speech: Speech normal.        Behavior: Behavior normal.        Thought Content: Thought content normal.        Judgment: Judgment normal.      UC Treatments / Results  Labs (all labs ordered are listed, but only abnormal results are displayed) Labs Reviewed - No data to display  EKG   Radiology No results found.  Procedures Procedures (including critical care time)  Medications Ordered in UC Medications  ibuprofen (ADVIL) tablet 800 mg (has no administration in time range)    Initial Impression / Assessment and Plan / UC Course  I have reviewed the triage vital signs and the nursing notes.  Pertinent labs & imaging results that were available during my care of the patient were reviewed by me and considered in my medical decision making (see chart for details).   Viral URI with cough Symptoms and physical exam consistent with a viral upper respiratory tract infection that will likely resolve with rest, fluids, and prescriptions for symptomatic relief. No indication for imaging today based on stable cardiopulmonary exam and hemodynamically stable vital signs.  Deferred viral testing today as there is very low suspicion that symptoms are related to COVID-19 or influenza.  He  is afebrile and nontoxic in appearance.  Deferred group A strep testing today as well as he does not meet [facility] criteria.  Patient given ibuprofen 800 mg in clinic today for sore throat.  Tessalon Perles sent to pharmacy for symptomatic relief to be taken as prescribed.   May purchase guaifenesin over-the-counter and take this as directed every 12 hours to help with nasal congestion and cough.  May use ibuprofen/tylenol over the counter for body aches, fever/chills, and overall discomfort associated with viral illness. Nonpharmacologic interventions for symptom relief provided and after visit summary below.   Strict ED/urgent care return precautions given.  Patient verbalizes understanding and agreement with plan.  Counseled patient regarding possible side effects and uses of all medications prescribed at today's visit.  Patient verbalizes understanding and agreement with plan.  All questions answered.  Patient discharged from urgent care in stable condition.        Final Clinical Impressions(s) / UC Diagnoses   Final diagnoses:  Viral URI with cough  Sore throat (viral)  Nasal congestion     Discharge Instructions      You have a viral upper respiratory infection.   Purchase mucinex (guaifenesin) 1200mg  and take this every 12 hours for the next few days to thin your nasal congestion and mucous so that you are able to get out of your body easier by coughing and blowing your nose. Drink plenty of water while taking this.  Take tessalon pearles every 8  hours as needed for cough.  You may take tylenol 1,000mg  and ibuprofen 600mg  every 6 hours with food as needed for fever/chills, sore throat, aches/pains, and inflammation associated with viral illness. Take this with food to avoid stomach upset.    You may do salt water and baking soda gargles every 4 hours as needed for your throat pain.  Please put 1 teaspoon of salt and 1/2 teaspoon of baking soda in 8 ounces of warm water then  gargle and spit the water out. You may also put 1 tablespoon of honey in warm water and drink this to soothe your throat.  Place a humidifier in your room at night to help decrease dry air that can irritate your airway and cause you to have a sore throat and cough.  Please try to eat a well-balanced diet while you are sick so that your body gets proper nutrition to heal.  If you develop any new or worsening symptoms, please return.  If your symptoms are severe, please go to the emergency room.  Follow-up with your primary care provider for further evaluation and management of your symptoms as well as ongoing wellness visits.  I hope you feel better!     ED Prescriptions     Medication Sig Dispense Auth. Provider   benzonatate (TESSALON) 100 MG capsule Take 1 capsule (100 mg total) by mouth every 8 (eight) hours. 21 capsule , FNP      PDMP not reviewed this encounter.   Carlisle Beers, Carlisle Beers 03/17/22 1457

## 2022-03-24 ENCOUNTER — Ambulatory Visit (HOSPITAL_COMMUNITY)
Admission: EM | Admit: 2022-03-24 | Discharge: 2022-03-24 | Disposition: A | Discharge status: Home / Self Care | Payer: Medicaid Other | Attending: Emergency Medicine | Admitting: Emergency Medicine

## 2022-03-24 ENCOUNTER — Encounter (HOSPITAL_COMMUNITY): Payer: Self-pay | Admitting: Emergency Medicine

## 2022-03-24 DIAGNOSIS — W540XXA Bitten by dog, initial encounter: Secondary | ICD-10-CM

## 2022-03-24 DIAGNOSIS — S81851A Open bite, right lower leg, initial encounter: Secondary | ICD-10-CM | POA: Diagnosis not present

## 2022-03-24 DIAGNOSIS — Z23 Encounter for immunization: Secondary | ICD-10-CM

## 2022-03-24 MED ORDER — TETANUS-DIPHTH-ACELL PERTUSSIS 5-2.5-18.5 LF-MCG/0.5 IM SUSY
0.5000 mL | PREFILLED_SYRINGE | Freq: Once | INTRAMUSCULAR | Status: AC
  Administered 2022-03-24: 0.5 mL via INTRAMUSCULAR

## 2022-03-24 MED ORDER — TETANUS-DIPHTH-ACELL PERTUSSIS 5-2.5-18.5 LF-MCG/0.5 IM SUSY
PREFILLED_SYRINGE | INTRAMUSCULAR | Status: AC
  Filled 2022-03-24: qty 0.5

## 2022-03-24 MED ORDER — AMOXICILLIN-POT CLAVULANATE 875-125 MG PO TABS: 1.0000 | ORAL_TABLET | Freq: Two times a day (BID) | ORAL | 0 refills | Status: AC

## 2022-03-24 NOTE — ED Provider Notes (Signed)
[facility]    CSN: [id number] Arrival date & time: 03/24/22  1652     History   Chief Complaint Chief Complaint  Patient presents with   Animal Bite    HPI Terry West is a 26 y.o. male.  Presents with dog bite to right lower leg Occurred earlier this morning, neighbors dog Reports 7/10 pain Didn't wash or clean the bite Dog is UTD on vaccines including rabies  Tetanus 10 years ago  Past Medical History:  Diagnosis Date   ALLERGIC RHINITIS 07/30/2008   Qualifier: Diagnosis of  By: Posey Rea MD, Georgina Quint    Anxiety    ASTHMA 07/30/2008   Qualifier: Diagnosis of  By: Posey Rea MD, Georgina Quint    GERD 11/30/2008   Qualifier: Diagnosis of  By: Posey Rea MD, Georgina Quint    SCOLIOSIS 10/04/2009   LS spine -- to R      Patient Active Problem List   Diagnosis Date Noted   Acute pain of right knee 05/31/2017   Chronic left shoulder pain 05/31/2017   Post concussion syndrome 05/31/2017   Erectile dysfunction 11/18/2015   URI (upper respiratory infection) 08/27/2015   Patient exposure to body fluids 01/06/2015   Pes planus 06/20/2013   Left wrist pain 06/06/2013   Pain in joint, ankle and foot 06/06/2013   LBP (low back pain) 06/06/2013   Well adult exam 11/27/2012   Headache 11/27/2012   Contusion of wrist, left 11/27/2012   Dyspepsia 09/27/2012   Unspecified constipation 09/27/2012   Testicle lump 09/27/2012   Acute thoracic back pain 09/19/2011   Anxiety disorder 09/19/2011   Epistaxis 09/19/2011   SCOLIOSIS 10/04/2009   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 Authorizing Provider  amoxicillin-clavulanate (AUGMENTIN) 875-125 MG tablet Take 1 tablet by mouth every  12 (twelve) hours for 3 days. 03/24/22 03/27/22 Yes Pace Lamadrid, Lurena Joiner, PA-C  albuterol (VENTOLIN HFA) 108 (90 Base) MCG/ACT inhaler Inhale 1-2 puffs into the lungs every 6 (six) hours as needed for wheezing or shortness of breath. 05/15/21   Dahlia Byes A, NP  benzonatate (TESSALON) 100 MG capsule Take 1 capsule (100 mg total) by mouth every 8 (eight) hours. 03/17/22   Carlisle Beers, FNP  ibuprofen (ADVIL) 800 MG tablet Take 1 tablet (800 mg total) by mouth every 8 (eight) hours as needed (pain). 11/08/21   Zenia Resides, MD    Family History Family History  Problem Relation Age of Onset   Thyroid disease Mother    Heart disease Mother    Healthy Father     Social History Social History   Tobacco Use   Smoking status: Never   Smokeless tobacco: Never  Vaping Use   Vaping Use: Never used  Substance Use Topics   Alcohol use: Yes    Comment: occasionally   Drug use: No     Allergies   Patient has no known allergies.   Review of Systems Review of Systems Per HPI  Physical Exam Triage Vital Signs ED Triage Vitals  Enc Vitals Group     BP 03/24/22 1828 130/80     Pulse Rate 03/24/22 1828 85     Resp 03/24/22 1828  17     Temp 03/24/22 1828 97.8 F (36.6 C)     Temp Source 03/24/22 1828 Oral     SpO2 03/24/22 1828 100 %     Weight --      Height --      Head Circumference --      Peak Flow --      Pain Score 03/24/22 1827 7     Pain Loc --      Pain Edu? --      Excl. in GC? --    No data found.  Updated Vital Signs BP 130/80 (BP Location: Left Arm)   Pulse 85   Temp 97.8 F (36.6 C) (Oral)   Resp 17   SpO2 100%    Physical Exam Vitals and nursing note reviewed.  Constitutional:      General: He is not in acute distress. HENT:     Mouth/Throat:     Pharynx: Oropharynx is clear.  Cardiovascular:     Rate and Rhythm: Normal rate and regular rhythm.     Pulses: Normal pulses.  Pulmonary:     Effort: Pulmonary effort is normal.   Musculoskeletal:        General: No swelling. Normal range of motion.  Skin:    General: Skin is warm and dry.     Findings: No bruising or erythema.     Comments: Shallow bite/abrasion to the right posterior calf. Not bleeding. A little tender  Neurological:     Mental Status: He is alert and oriented to person, place, and time.     UC Treatments / Results  Labs (all labs ordered are listed, but only abnormal results are displayed) Labs Reviewed - No data to display  EKG  Radiology No results found.  Procedures Procedures   Medications Ordered in UC Medications  Tdap (BOOSTRIX) injection 0.5 mL (0.5 mLs Intramuscular Given 03/24/22 1928)    Initial Impression / Assessment and Plan / UC Course  I have reviewed the triage vital signs and the nursing notes.  Pertinent labs & imaging results that were available during my care of the patient were reviewed by me and considered in my medical decision making (see chart for details).  Cleansed in clinic and dressed. Tetanus updated. Augmentin BID x 3 days for bite prophylaxis  Discussed signs of infection to look for Return precautions discussed. Patient agrees to plan  Final Clinical Impressions(s) / UC Diagnoses   Final diagnoses:  Dog bite of right lower leg, initial encounter  Need for Tdap vaccination     Discharge Instructions      Please take medication as prescribed. Take with food to avoid upset stomach. This will help to prevent infection that may be caused by a dog bite.  Please monitor the wound for any signs of infection. Keep it clean and dry. Wash with soap and water.  We have updated your tetanus today.    ED Prescriptions     Medication Sig Dispense Auth. Provider   amoxicillin-clavulanate (AUGMENTIN) 875-125 MG tablet Take 1 tablet by mouth every 12 (twelve) hours for 3 days. 6 tablet Velencia Lenart, Lurena Joiner, PA-C      PDMP not reviewed this encounter.   Kathrine Haddock 03/24/22 1929

## 2022-03-24 NOTE — Discharge Instructions (Addendum)
Please take medication as prescribed. Take with food to avoid upset stomach. This will help to prevent infection that may be caused by a dog bite.  Please monitor the wound for any signs of infection. Keep it clean and dry. Wash with soap and water.  We have updated your tetanus today.

## 2022-03-24 NOTE — ED Triage Notes (Signed)
Pt reports a dog bite on the right lower leg around 10/11 am this morning. Reports he was told the dog was UTD on shots.

## 2022-04-13 ENCOUNTER — Ambulatory Visit (INDEPENDENT_AMBULATORY_CARE_PROVIDER_SITE_OTHER): Payer: Medicaid Other

## 2022-04-13 ENCOUNTER — Other Ambulatory Visit: Payer: Self-pay

## 2022-04-13 ENCOUNTER — Encounter (HOSPITAL_COMMUNITY): Payer: Self-pay | Admitting: Emergency Medicine

## 2022-04-13 ENCOUNTER — Ambulatory Visit (HOSPITAL_COMMUNITY)
Admission: EM | Admit: 2022-04-13 | Discharge: 2022-04-13 | Disposition: A | Discharge status: Home / Self Care | Payer: Medicaid Other | Attending: Family Medicine | Admitting: Family Medicine

## 2022-04-13 DIAGNOSIS — R0602 Shortness of breath: Secondary | ICD-10-CM

## 2022-04-13 DIAGNOSIS — J4521 Mild intermittent asthma with (acute) exacerbation: Secondary | ICD-10-CM

## 2022-04-13 MED ORDER — PREDNISONE 10 MG (21) PO TBPK: ORAL_TABLET | Freq: Every day | ORAL | 0 refills | Status: DC

## 2022-04-13 MED ORDER — OLOPATADINE HCL 0.2 % OP SOLN: 1.0000 [drp] | Freq: Every day | OPHTHALMIC | 0 refills | Status: DC

## 2022-04-13 NOTE — ED Provider Notes (Signed)
[place]   [id number] 04/13/22 Arrival Time: 0825  ASSESSMENT & PLAN:  1. SOB (shortness of breath)   2. Mild intermittent asthma with acute exacerbation    I have personally viewed the imaging studies ordered this visit. CXR without acute changes.  Question allergic in nature. H/O asthma. Reports wheezing. No current CP/SOB. No resp distress.  Begin trial of: Meds ordered this encounter  Medications   predniSONE (STERAPRED UNI-PAK 21 TAB) 10 MG (21) TBPK tablet    Sig: Take by mouth daily. Take as directed.    Dispense:  21 tablet    Refill:  0   Olopatadine HCl 0.2 % SOLN    Sig: Apply 1 drop to eye daily.    Dispense:  2.5 mL    Refill:  0   Reviewed expectations re: course of current medical issues. Questions answered. Outlined signs and symptoms indicating need for more acute intervention. Patient verbalized understanding. After Visit Summary given.   SUBJECTIVE:  History from: patient. Terry West is a 27 y.o. male who presents with complaint of occas SOB; questions wheezing; fatigue. Past month. Questions related to black mold in his dwelling. Bilateral eye irritation; sev days; watery drainage. H/O seasonal allergies; takes allergy medication randomly. No fevers. No weight change. Normal PO intake without n/v/d. Does not smoke. No vaping.   OBJECTIVE:  Vitals:   04/13/22 1031  BP: 134/68  Pulse: 72  Resp: 18  Temp: 98 F (36.7 C)  TempSrc: Oral  SpO2: 98%    General appearance: alert, oriented, no acute distress Eyes: PERRLA; EOMI; conjunctivae with mild erythema and watery drainage HENT: normocephalic; atraumatic Neck: supple with FROM Lungs: without labored respirations; speaks full sentences without difficulty; CTAB Heart: regular rate and rhythm without murmer Chest Wall: without tenderness to palpation Abdomen: soft, non-tender; no guarding or rebound tenderness Extremities: without edema; without calf swelling or tenderness;  symmetrical without gross deformities Skin: warm and dry; without rash or lesions Neuro: normal gait Psychological: alert and cooperative; normal mood and affect   Imaging: DG Chest 2 View  Result Date: 04/13/2022 CLINICAL DATA:  Symptoms started over a month ago. Seen with uri, same symptoms as now. Reports feeling slightly better, but reports symptoms worsened recently. Bilateral eye redness for 3 days, runny nose, sob, constant phlegm in throat, nose b.*comment was truncated*. EXAM: CHEST - 2 VIEW COMPARISON:  None Available. FINDINGS: The heart size and mediastinal contours are within normal limits. Both lungs are clear. The visualized skeletal structures are unremarkable. IMPRESSION: No active cardiopulmonary disease. Electronically Signed   By: Suzy Bouchard M.D.   On: 04/13/2022 11:00     No Known Allergies  Past Medical History:  Diagnosis Date   ALLERGIC RHINITIS 07/30/2008   Qualifier: Diagnosis of  By: Alain Marion MD, Evie Lacks    Anxiety    ASTHMA 07/30/2008   Qualifier: Diagnosis of  By: Alain Marion MD, Evie Lacks    GERD 11/30/2008   Qualifier: Diagnosis of  By: Alain Marion MD, Evie Lacks    SCOLIOSIS 10/04/2009   LS spine -- to R     Social History   Socioeconomic History   Marital status: Single    Spouse name: Not on file   Number of children: Not on file   Years of education: Not on file   Highest education level: Not on file  Occupational History   Not on file  Tobacco Use   Smoking status: Never   Smokeless tobacco: Never  Vaping  Use   Vaping Use: Never used  Substance and Sexual Activity   Alcohol use: Yes    Comment: occasionally   Drug use: No   Sexual activity: Not on file    Comment: using condomes most of the time  Other Topics Concern   Not on file  Social History Narrative   Not on file   Social Determinants of Health   Financial Resource Strain: Not on file  Food Insecurity: Not on file  Transportation Needs: Not on file  Physical Activity:  Not on file  Stress: Not on file  Social Connections: Not on file  Intimate Partner Violence: Not on file   Family History  Problem Relation Age of Onset   Thyroid disease Mother    Heart disease Mother    Healthy Father    Past Surgical History:  Procedure Laterality Date   WISDOM TOOTH EXTRACTION        Vanessa Kick, MD 04/13/22 1251

## 2022-04-13 NOTE — ED Triage Notes (Signed)
Symptoms started over a month ago.  Seen with uri, same symptoms as now.  Reports feeling slightly better, but reports symptoms worsened recently.  Bilateral eye redness for 3 days, runny nose, sob, constant phlegm in throat, nose bleeds, aching, fatigue.  Patient feels this is exposure to black mold.  Wants a test to prove to landlord that he is exposed to black mold

## 2022-04-29 ENCOUNTER — Emergency Department (HOSPITAL_COMMUNITY)
Admission: EM | Admit: 2022-04-29 | Discharge: 2022-04-29 | Disposition: A | Discharge status: Home / Self Care | Payer: Medicaid Other | Attending: Emergency Medicine | Admitting: Emergency Medicine

## 2022-04-29 ENCOUNTER — Emergency Department (HOSPITAL_COMMUNITY): Payer: Medicaid Other

## 2022-04-29 ENCOUNTER — Other Ambulatory Visit: Payer: Self-pay

## 2022-04-29 DIAGNOSIS — R0602 Shortness of breath: Secondary | ICD-10-CM | POA: Diagnosis present

## 2022-04-29 DIAGNOSIS — J45909 Unspecified asthma, uncomplicated: Secondary | ICD-10-CM | POA: Insufficient documentation

## 2022-04-29 DIAGNOSIS — Z20822 Contact with and (suspected) exposure to covid-19: Secondary | ICD-10-CM | POA: Diagnosis not present

## 2022-04-29 DIAGNOSIS — J069 Acute upper respiratory infection, unspecified: Secondary | ICD-10-CM | POA: Diagnosis not present

## 2022-04-29 LAB — CBC WITH DIFFERENTIAL/PLATELET
Abs Immature Granulocytes: 0.03 10*3/uL (ref 0.00–0.07)
Basophils Absolute: 0 10*3/uL (ref 0.0–0.1)
Basophils Relative: 0 %
Eosinophils Absolute: 0.2 10*3/uL (ref 0.0–0.5)
Eosinophils Relative: 2 %
HCT: 49.7 % (ref 39.0–52.0)
Hemoglobin: 16.5 g/dL (ref 13.0–17.0)
Immature Granulocytes: 0 %
Lymphocytes Relative: 20 %
Lymphs Abs: 2 10*3/uL (ref 0.7–4.0)
MCH: 29.3 pg (ref 26.0–34.0)
MCHC: 33.2 g/dL (ref 30.0–36.0)
MCV: 88.1 fL (ref 80.0–100.0)
Monocytes Absolute: 0.7 10*3/uL (ref 0.1–1.0)
Monocytes Relative: 7 %
Neutro Abs: 7.1 10*3/uL (ref 1.7–7.7)
Neutrophils Relative %: 71 %
Platelets: 255 10*3/uL (ref 150–400)
RBC: 5.64 MIL/uL (ref 4.22–5.81)
RDW: 12.4 % (ref 11.5–15.5)
WBC: 10 10*3/uL (ref 4.0–10.5)
nRBC: 0 % (ref 0.0–0.2)

## 2022-04-29 LAB — RESP PANEL BY RT-PCR (RSV, FLU A&B, COVID)  RVPGX2
Influenza A by PCR: NEGATIVE
Influenza B by PCR: NEGATIVE
Resp Syncytial Virus by PCR: NEGATIVE
SARS Coronavirus 2 by RT PCR: NEGATIVE

## 2022-04-29 LAB — BASIC METABOLIC PANEL
Anion gap: 9 (ref 5–15)
BUN: 6 mg/dL (ref 6–20)
CO2: 27 mmol/L (ref 22–32)
Calcium: 9.7 mg/dL (ref 8.9–10.3)
Chloride: 101 mmol/L (ref 98–111)
Creatinine, Ser: 1.08 mg/dL (ref 0.61–1.24)
GFR, Estimated: >60 mL/min (ref >60)
Glucose, Bld: 110 mg/dL — ABNORMAL HIGH (ref 70–99)
Potassium: 3.9 mmol/L (ref 3.5–5.1)
Sodium: 137 mmol/L (ref 135–145)

## 2022-04-29 LAB — TROPONIN I (HIGH SENSITIVITY): Troponin I (High Sensitivity): 3 ng/L (ref <18)

## 2022-04-29 MED ORDER — CETIRIZINE HCL 10 MG PO TABS: 10.0000 mg | ORAL_TABLET | Freq: Every day | ORAL | 0 refills | Status: DC

## 2022-04-29 MED ORDER — OLOPATADINE HCL 0.2 % OP SOLN: 1.0000 [drp] | Freq: Every day | OPHTHALMIC | 0 refills | Status: DC

## 2022-04-29 NOTE — ED Triage Notes (Signed)
Pt c/o ongoing SOB ongoing since Thanksgiving. States concern for mold in his home. SOB associated with left sided chest tightness, fatigue, and eye irritation. Has been scene at urgent care with ENT follow up scheduled on 2/13. No respiratory distress.

## 2022-04-29 NOTE — ED Provider Triage Note (Signed)
Emergency Medicine Provider Triage Evaluation Note  Terry West , a 27 y.o. male  was evaluated in triage.  Pt complains of left-sided chest pain, shortness of breath and bilateral injected red eyes for the last 2 to 3 months.  Has been seen in urgent care multiple times and tried multiple treatments with the eyes are still red, no vision changes.  Concerned he may have had mold in his house.  No history of asthma, does not smoke cigarettes.  Review of Systems  Per HPI  Physical Exam  BP (!) 141/89 (BP Location: Left Arm)   Pulse 91   Temp 97.7 F (36.5 C)   Resp 20   Ht 6\' 1"  (1.854 m)   Wt 87.5 kg   SpO2 99%   BMI 25.46 kg/m  Gen:   Awake, no distress   Resp:  Normal effort  MSK:   Moves extremities without difficulty  Other:  Bilateral injected conjunctive a  Medical Decision Making  Medically screening exam initiated at 7:21 PM.  Appropriate orders placed.  Terry West was informed that the remainder of the evaluation will be completed by another provider, this initial triage assessment does not replace that evaluation, and the importance of remaining in the ED until their evaluation is complete.     Sherrill Raring, PA-C 04/29/22 1922

## 2022-04-29 NOTE — Discharge Instructions (Signed)
You can follow-up with primary care and potentially with pulmonary also.

## 2022-04-30 NOTE — ED Provider Notes (Signed)
[facility] Provider Note   CSN: [id number] Arrival date & time: 04/29/22  1901     History  Chief Complaint  Patient presents with   Shortness of Breath    Terry West is a 27 y.o. male.   Shortness of Breath Patient is shortness of breath.  Is had for 2 to 3 months now.  Started November.  Has seen providers and urgent cares for it.  Has been on antibiotics and steroids.  States he is worried he has black mold in his house.  Has a picture from the previous tenant that did show some mold.  Eyes are somewhat injected.  Has been taking some drops that helped.  No relief with steroids or antibiotics.  Has an appoint with the ENT in about 3 weeks.    Past Medical History:  Diagnosis Date   ALLERGIC RHINITIS 07/30/2008   Qualifier: Diagnosis of  By: Alain Marion MD, Evie Lacks    Anxiety    ASTHMA 07/30/2008   Qualifier: Diagnosis of  By: Alain Marion MD, Evie Lacks    GERD 11/30/2008   Qualifier: Diagnosis of  By: Alain Marion MD, Evie Lacks    SCOLIOSIS 10/04/2009   LS spine -- to R      Home Medications Prior to Admission medications   Medication Sig Start Date End Date Taking? Authorizing Provider  cetirizine (ZYRTEC ALLERGY) 10 MG tablet Take 1 tablet (10 mg total) by mouth daily. 04/29/22  Yes Davonna Belling, MD  ibuprofen (ADVIL) 800 MG tablet Take 1 tablet (800 mg total) by mouth every 8 (eight) hours as needed (pain). 11/08/21   Barrett Henle, MD  Olopatadine HCl 0.2 % SOLN Apply 1 drop to eye daily. 04/29/22   Davonna Belling, MD  predniSONE (STERAPRED UNI-PAK 21 TAB) 10 MG (21) TBPK tablet Take by mouth daily. Take as directed. 04/13/22   Vanessa Kick, MD      Allergies    Patient has no known allergies.    Review of Systems   Review of Systems  Respiratory:  Positive for shortness of breath.     Physical Exam Updated Vital Signs BP (!) 141/89 (BP Location: Left Arm)   Pulse 91   Temp 97.7 F (36.5 C)   Resp 20    Ht 6\' 1"  (1.854 m)   Wt 87.5 kg   SpO2 99%   BMI 25.46 kg/m  Physical Exam Vitals and nursing note reviewed.  HENT:     Head: Atraumatic.  Eyes:     Comments: Conjunctiva injected bilaterally.  Cardiovascular:     Rate and Rhythm: Regular rhythm.  Pulmonary:     Breath sounds: No wheezing or rhonchi.  Chest:     Chest wall: No tenderness.  Musculoskeletal:     Right lower leg: No tenderness.     Left lower leg: No tenderness.  Neurological:     Mental Status: He is alert.     ED Results / Procedures / Treatments   Labs (all labs ordered are listed, but only abnormal results are displayed) Labs Reviewed  BASIC METABOLIC PANEL - Abnormal; Notable for the following components:      Result Value   Glucose, Bld 110 (*)    All other components within normal limits  RESP PANEL BY RT-PCR (RSV, FLU A&B, COVID)  [id number]  CBC WITH DIFFERENTIAL/PLATELET  TROPONIN I (HIGH SENSITIVITY)    EKG EKG Interpretation  Date/Time:  Saturday April 29 2022 19:28:20 EST  Ventricular Rate:  96 PR Interval:  160 QRS Duration: 88 QT Interval:  324 QTC Calculation: 409 R Axis:   93 Text Interpretation: * Suspect arm lead reversal, interpretation assumes no reversal Normal sinus rhythm Right atrial enlargement Rightward axis Pulmonary disease pattern Nonspecific ST abnormality Abnormal ECG When compared with ECG of 03-Oct-2009 10:20, No significant change since last tracing Confirmed by Davonna Belling [id number]) on 04/29/2022 8:19:18 PM  Radiology DG Chest 2 View  Result Date: 04/29/2022 CLINICAL DATA:  Shortness of breath since Thanksgiving, associated LEFT side chest tightness, fatigue, eye irritation, question mold exposure EXAM: CHEST - 2 VIEW COMPARISON:  04/13/2022 FINDINGS: Normal heart size, mediastinal contours, and pulmonary vascularity. Lungs clear. No pulmonary infiltrate, pleural effusion, or pneumothorax. Minimal chronic peribronchial thickening unchanged. Levoconvex thoracic  scoliosis. IMPRESSION: Minimal chronic bronchitic changes without infiltrate. Electronically Signed   By: Lavonia Dana M.D.   On: 04/29/2022 19:59    Procedures Procedures    Medications Ordered in ED Medications - No data to display  ED Course/ Medical Decision Making/ A&P                             Medical Decision Making Risk OTC drugs.   Patient with URI symptoms cough nasal congestion over the last few months.  Chest tightness at time.  Workup reassuring here.  X-ray showed chronic changes.  Does not smoke.  No relief with conservative manage previously.  Will try some antihistamines but patient states he also has been taking some previously without result.  Will also continue Pataday drops.  Has ENT follow-up.  Potentially pulmonary follow-up could help 2.  Doubt pneumonia.  Antibiotics have not helped.  No infiltrate.  Potentially could be mold but needs further outpatient workup.        Final Clinical Impression(s) / ED Diagnoses Final diagnoses:  Upper respiratory tract infection, unspecified type    Rx / DC Orders ED Discharge Orders          Ordered    cetirizine (ZYRTEC ALLERGY) 10 MG tablet  Daily        04/29/22 2047    Olopatadine HCl 0.2 % SOLN  Daily        04/29/22 2047              Davonna Belling, MD 04/30/22 1458

## 2022-05-23 ENCOUNTER — Ambulatory Visit: Payer: Self-pay | Admitting: Allergy & Immunology

## 2022-06-20 ENCOUNTER — Encounter: Payer: Self-pay | Admitting: Allergy & Immunology

## 2022-06-20 ENCOUNTER — Ambulatory Visit (INDEPENDENT_AMBULATORY_CARE_PROVIDER_SITE_OTHER): Payer: Medicaid Other | Admitting: Allergy & Immunology

## 2022-06-20 ENCOUNTER — Other Ambulatory Visit: Payer: Self-pay

## 2022-06-20 VITALS — BP 130/72 | HR 88 | Temp 98.0°F | Resp 20 | Ht 73.0 in | Wt 209.0 lb

## 2022-06-20 DIAGNOSIS — J3089 Other allergic rhinitis: Secondary | ICD-10-CM | POA: Diagnosis not present

## 2022-06-20 DIAGNOSIS — J310 Chronic rhinitis: Secondary | ICD-10-CM

## 2022-06-20 DIAGNOSIS — J302 Other seasonal allergic rhinitis: Secondary | ICD-10-CM | POA: Diagnosis not present

## 2022-06-20 DIAGNOSIS — J4540 Moderate persistent asthma, uncomplicated: Secondary | ICD-10-CM

## 2022-06-20 MED ORDER — CETIRIZINE HCL 10 MG PO TABS: 10.0000 mg | ORAL_TABLET | Freq: Every day | ORAL | 1 refills | Status: DC

## 2022-06-20 MED ORDER — FLUTICASONE FUROATE-VILANTEROL 200-25 MCG/ACT IN AEPB: 1.0000 | INHALATION_SPRAY | Freq: Every day | RESPIRATORY_TRACT | 5 refills | Status: DC

## 2022-06-20 MED ORDER — MONTELUKAST SODIUM 10 MG PO TABS: 10.0000 mg | ORAL_TABLET | Freq: Every day | ORAL | 1 refills | Status: DC

## 2022-06-20 NOTE — Patient Instructions (Addendum)
1. Moderate persistent asthma, uncomplicated - Lung testing was in the 30% range, but it did improve with the albuterol treatment to 88%. - I am not sure whether this is more related to the actual improvement with the nebulizer versus difficulty doing the test initially, but it could be a combination of the 2. - We are going to start you on a daily medication to treat asthma that contains an inhaled steroid and a long-acting albuterol called Breo. - Daily controller medication(s): Breo 200/11mg one puff once daily - Prior to physical activity: albuterol 2 puffs 10-15 minutes before physical activity. - Rescue medications: albuterol 4 puffs every 4-6 hours as needed - Asthma control goals:  * Full participation in all desired activities (may need albuterol before activity) * Albuterol use two time or less a week on average (not counting use with activity) * Cough interfering with sleep two time or less a month * Oral steroids no more than once a year * No hospitalizations  2. Chronic rhinitis - Testing today showed: grasses, ragweed, weeds, trees, dust mites, cat, and dog - Copy of test results provided.  - Avoidance measures provided. - Molds were all negative, but there is a possibility that you were reacting to the sheer amount of spores (where they were so concentrated that anyone would react to them, even if they were not allergic to molds). - It seems that your symptoms are better controlled in your new environment, anyway, so that is good news.  - Stop taking:  - Continue with:  - Start taking: Zyrtec (cetirizine) '10mg'$  tablet once daily and Singulair (montelukast) '10mg'$  daily - Singulair can cause irritability and bad dreams, although most people tolerate it just fine. - If this happens, stop the medication and give [facility] call. - You can use an extra dose of the antihistamine, if needed, for breakthrough symptoms.  - Consider nasal saline rinses 1-2 times daily to remove allergens from  the nasal cavities as well as help with mucous clearance (this is especially helpful to do before the nasal sprays are given) - Consider allergy shots as a means of long-term control. - Strongly consider shots "re-train" and "reset" the immune system to ignore environmental allergens and decrease the resulting immune response to those allergens (sneezing, itchy watery eyes, runny nose, nasal congestion, etc).    - Allergy shots improve symptoms in 75-85% of patients.  - We can discuss more at the next appointment if the medications are not working for you.  3. Return in about 6 weeks (around 08/01/2022). You can have the follow up appointment with Dr. GErnst Bowleror a Nurse Practicioner (our Nurse Practitioners are excellent and always have Physician oversight!).    Please inform [place] any Emergency Department visits, hospitalizations, or changes in symptoms. Call [place] going to the ED for breathing or allergy symptoms since we might be able to fit you in for a sick visit. Feel free to contact u[place]anytime with any questions, problems, or concerns.  It was a pleasure to meet you today!  Websites that have reliable patient information: 1. American Academy of Asthma, Allergy, and Immunology: www.aaaai.org 2. Food Allergy Research and Education (FARE): foodallergy.org 3. Mothers of Asthmatics: http://www.asthmacommunitynetwork.org 4. American College of Allergy, Asthma, and Immunology: www.acaai.org   COVID-19 Vaccine Information can be found at: [email or web address] questions related to vaccine distribution or appointments, please email [email or web address] or call [phone]   We realize that you might be concerned about having an allergic reaction  to the COVID19 vaccines. To help with that concern, WE ARE OFFERING THE COVID19 VACCINES IN OUR OFFICE! Ask the front desk for dates!     "Like" [place] on Facebook and Instagram for our  latest updates!      A healthy democracy works best when [employer] participate! Make sure you are registered to vote! If you have moved or changed any of your contact information, you will need to get this updated before voting!  In some cases, you MAY be able to register to vote online: [email or web address]      Airborne Adult Perc - 06/20/22 1026     Time Antigen Placed 1026    Allergen Manufacturer Lavella Hammock    Location Back    Number of Test 59    1. Control-Buffer 50% Glycerol Negative    2. Control-Histamine 1 mg/ml 2+    3. Albumin saline Negative    4. [place] Negative    5. [place] 3+    6. Johnson Negative    7. [place] Blue 3+    8. Meadow Fescue 3+    9. Perennial Rye 3+    10. Sweet Vernal 3+    11. Timothy 3+    12. Cocklebur Negative    13. Burweed Marshelder Negative    14. Ragweed, short 3+    15. Ragweed, Giant 2+    16. Plantain,  English 2+    17. Lamb's Quarters 2+    18. Sheep Sorrell Negative    19. Rough Pigweed 3+    20. Marsh Elder, Rough 3+    21. Mugwort, Common Negative    22. Ash mix 3+    23. Birch mix 2+    24. Beech American 2+    25. Box, Elder 2+    26. Cedar, red Negative    27. Cottonwood, [place] Negative    28. Elm mix 2+    29. Hickory 3+    30. Maple mix 2+    31. Oak, [place] mix 4+    32. Pecan Pollen 3+    33. Pine mix Negative    34. Sycamore Eastern Negative    35. Caribou, Black Pollen 2+    36. Alternaria alternata Negative    37. Cladosporium Herbarum Negative    38. Aspergillus mix Negative    39. Penicillium mix Negative    40. Bipolaris sorokiniana (Helminthosporium) Negative    41. Drechslera spicifera (Curvularia) Negative    42. Mucor plumbeus Negative    43. Fusarium moniliforme Negative    44. Aureobasidium pullulans (pullulara) Negative    45. Rhizopus oryzae Negative    46. Botrytis cinera Negative    47. Epicoccum nigrum Negative    48. Phoma betae Negative    49.  Candida Albicans Negative    50. Trichophyton mentagrophytes Negative    51. Mite, D Farinae  5,000 AU/ml 3+    52. Mite, D Pteronyssinus  5,000 AU/ml Negative    53. Cat Hair 10,000 BAU/ml 3+    54.  Dog Epithelia 2+    55. Mixed Feathers Negative    56. Horse Epithelia Negative    57. Cockroach, German Negative    58. Mouse Negative    59. Tobacco Leaf Negative             Intradermal - 06/20/22 1101     Time Antigen Placed 1105    Allergen Manufacturer Lavella Hammock    Location Arm    Number of Test 6  Control Negative    Mold 1 Negative    Mold 2 Negative    Mold 3 Negative    Mold 4 Negative    Cockroach Negative             Reducing Pollen Exposure  The American Academy of Allergy, Asthma and Immunology suggests the following steps to reduce your exposure to pollen during allergy seasons.    Do not hang sheets or clothing out to dry; pollen may collect on these items. Do not mow lawns or spend time around freshly cut grass; mowing stirs up pollen. Keep windows closed at night.  Keep car windows closed while driving. Minimize morning activities outdoors, a time when pollen counts are usually at their highest. Stay indoors as much as possible when pollen counts or humidity is high and on windy days when pollen tends to remain in the air longer. Use air conditioning when possible.  Many air conditioners have filters that trap the pollen spores. Use a HEPA room air filter to remove pollen form the indoor air you breathe.  Control of Dog or Cat Allergen  Avoidance is the best way to manage a dog or cat allergy. If you have a dog or cat and are allergic to dog or cats, consider removing the dog or cat from the home. If you have a dog or cat but don't want to find it a new home, or if your family wants a pet even though someone in the household is allergic, here are some strategies that may help keep symptoms at bay:  Keep the pet out of your bedroom and restrict it to  only a few rooms. Be advised that keeping the dog or cat in only one room will not limit the allergens to that room. Don't pet, hug or kiss the dog or cat; if you do, wash your hands with soap and water. High-efficiency particulate air (HEPA) cleaners run continuously in a bedroom or living room can reduce allergen levels over time. Regular use of a high-efficiency vacuum cleaner or a central vacuum can reduce allergen levels. Giving your dog or cat a bath at least once a week can reduce airborne allergen.  Control of Dust Mite Allergen    Dust mites play a major role in allergic asthma and rhinitis.  They occur in environments with high humidity wherever human skin is found.  Dust mites absorb humidity from the atmosphere (ie, they do not drink) and feed on organic matter (including shed human and animal skin).  Dust mites are a microscopic type of insect that you cannot see with the naked eye.  High levels of dust mites have been detected from mattresses, pillows, carpets, upholstered furniture, bed covers, clothes, soft toys and any woven material.  The principal allergen of the dust mite is found in its feces.  A gram of dust may contain 1,000 mites and 250,000 fecal particles.  Mite antigen is easily measured in the air during house cleaning activities.  Dust mites do not bite and do not cause harm to humans, other than by triggering allergies/asthma.    Ways to decrease your exposure to dust mites in your home:  Encase mattresses, box springs and pillows with a mite-impermeable barrier or cover   Wash sheets, blankets and drapes weekly in hot water (130 F) with detergent and dry them in a dryer on the hot setting.  Have the room cleaned frequently with a vacuum cleaner and a damp dust-mop.  For carpeting or  rugs, vacuuming with a vacuum cleaner equipped with a high-efficiency particulate air (HEPA) filter.  The dust mite allergic individual should not be in a room which is being cleaned and  should wait 1 hour after cleaning before going into the room. Do not sleep on upholstered furniture (eg, couches).   If possible removing carpeting, upholstered furniture and drapery from the home is ideal.  Horizontal blinds should be eliminated in the rooms where the person spends the most time (bedroom, study, television room).  Washable vinyl, roller-type shades are optimal. Remove all non-washable stuffed toys from the bedroom.  Wash stuffed toys weekly like sheets and blankets above.   Reduce indoor humidity to less than 50%.  Inexpensive humidity monitors can be purchased at most hardware stores.  Do not use a humidifier as can make the problem worse and are not recommended.  Allergy Shots  Allergies are the result of a chain reaction that starts in the immune system. Your immune system controls how your body defends itself. For instance, if you have an allergy to pollen, your immune system identifies pollen as an invader or allergen. Your immune system overreacts by producing antibodies called Immunoglobulin E (IgE). These antibodies travel to cells that release chemicals, causing an allergic reaction.  The concept behind allergy immunotherapy, whether it is received in the form of shots or tablets, is that the immune system can be desensitized to specific allergens that trigger allergy symptoms. Although it requires time and patience, the payback can be long-term relief. Allergy injections contain a dilute solution of those substances that you are allergic to based upon your skin testing and allergy history.   How Do Allergy Shots Work?  Allergy shots work much like a vaccine. Your body responds to injected amounts of a particular allergen given in increasing doses, eventually developing a resistance and tolerance to it. Allergy shots can lead to decreased, minimal or no allergy symptoms.  There generally are two phases: build-up and maintenance. Build-up often ranges from three to six  months and involves receiving injections with increasing amounts of the allergens. The shots are typically given once or twice a week, though more rapid build-up schedules are sometimes used.  The maintenance phase begins when the most effective dose is reached. This dose is different for each person, depending on how allergic you are and your response to the build-up injections. Once the maintenance dose is reached, there are longer periods between injections, typically two to four weeks.  Occasionally doctors give cortisone-type shots that can temporarily reduce allergy symptoms. These types of shots are different and should not be confused with allergy immunotherapy shots.  Who Can Be Treated with Allergy Shots?  Allergy shots may be a good treatment approach for people with allergic rhinitis (hay fever), allergic asthma, conjunctivitis (eye allergy) or stinging insect allergy.   Before deciding to begin allergy shots, you should consider:   The length of allergy season and the severity of your symptoms  Whether medications and/or changes to your environment can control your symptoms  Your desire to avoid long-term medication use  Time: allergy immunotherapy requires a major time commitment  Cost: may vary depending on your insurance coverage  Allergy shots for children age 66 and older are effective and often well tolerated. They might prevent the onset of new allergen sensitivities or the progression to asthma.  Allergy shots are not started on patients who are pregnant but can be continued on patients who become pregnant while receiving them. In  some patients with other medical conditions or who take certain common medications, allergy shots may be of risk. It is important to mention other medications you talk to your allergist.   What are the two types of build-ups offered:   RUSH or Rapid Desensitization -- one day of injections lasting from 8:30-4:30pm, injections every 1 hour.   Approximately half of the build-up process is completed in that one day.  The following week, normal build-up is resumed, and this entails ~16 visits either weekly or twice weekly, until reaching your "maintenance dose" which is continued weekly until eventually getting spaced out to every month for a duration of 3 to 5 years. The regular build-up appointments are nurse visits where the injections are administered, followed by required monitoring for 30 minutes.    Traditional build-up -- weekly visits for 6 -12 months until reaching "maintenance dose", then continue weekly until eventually spacing out to every 4 weeks as above. At these appointments, the injections are administered, followed by required monitoring for 30 minutes.     Either way is acceptable, and both are equally effective. With the rush protocol, the advantage is that less time is spent here for injections overall AND you would also reach maintenance dosing faster (which is when the clinical benefit starts to become more apparent). Not everyone is a candidate for rapid desensitization.   IF we proceed with the RUSH protocol, there are premedications which must be taken the day before and the day after the rush only (this includes antihistamines, steroids, and Singulair).  After the rush day, no prednisone or Singulair is required, and we just recommend antihistamines taken on your injection day.  What Is An Estimate of the Costs?  If you are interested in starting allergy injections, please check with your insurance company about your coverage for both allergy vial sets and allergy injections.  Please do so prior to making the appointment to start injections.  The following are CPT codes to give to your insurance company. These are the amounts we BILL to the insurance company, but the amount YOU WILL PAY and [place] and depends on the contracts we have with different insurance companies.   Amount Billed to  Insurance One allergy vial set  CPT 95165   $ 1200     Two allergy vial set  CPT 95165   $ 2400     Three allergy vial set  CPT 95165   $ 3600     One injection   CPT 95115   $ 35  Two injections   CPT 95117   $ 40 RUSH (Rapid Desensitization) CPT 95180 x 8 hours $500/hour  Regarding the allergy injections, your co-pay may or may not apply with each injection, so please confirm this with your insurance company. When you start allergy injections, 1 or 2 sets of vials are made based on your allergies.  Not all patients can be on one set of vials. A set of vials lasts 6 months to a year depending on how quickly you can proceed with your build-up of your allergy injections. Vials are personalized for each patient depending on their specific allergens.  How often are allergy injection given during the build-up period?   Injections are given at least weekly during the build-up period until your maintenance dose is achieved. Per the doctor's discretion, you may have the option of getting allergy injections two times per week during the build-up period. However, there must be at  least 48 hours between injections. The build-up period is usually completed within 6-12 months depending on your ability to schedule injections and for adjustments for reactions. When maintenance dose is reached, your injection schedule is gradually changed to every two weeks and later to every three weeks. Injections will then continue every 4 weeks. Usually, injections are continued for a total of 3-5 years.   When Will I Feel Better?  Some may experience decreased allergy symptoms during the build-up phase. For others, it may take as long as 12 months on the maintenance dose. If there is no improvement after a year of maintenance, your allergist will discuss other treatment options with you.  If you aren't responding to allergy shots, it may be because there is not enough dose of the allergen in your vaccine or there are missing  allergens that were not identified during your allergy testing. Other reasons could be that there are high levels of the allergen in your environment or major exposure to non-allergic triggers like tobacco smoke.  What Is the Length of Treatment?  Once the maintenance dose is reached, allergy shots are generally continued for three to five years. The decision to stop should be discussed with your allergist at that time. Some people may experience a permanent reduction of allergy symptoms. Others may relapse and a longer course of allergy shots can be considered.  What Are the Possible Reactions?  The two types of adverse reactions that can occur with allergy shots are local and systemic. Common local reactions include very mild redness and swelling at the injection site, which can happen immediately or several hours after. Report a delayed reaction from your last injection. These include arm swelling or runny nose, watery eyes or cough that occurs within 12-24 hours after injection. A systemic reaction, which is less common, affects the entire body or a particular body system. They are usually mild and typically respond quickly to medications. Signs include increased allergy symptoms such as sneezing, a stuffy nose or hives.   Rarely, a serious systemic reaction called anaphylaxis can develop. Symptoms include swelling in the throat, wheezing, a feeling of tightness in the chest, nausea or dizziness. Most serious systemic reactions develop within 30 minutes of allergy shots. This is why it is strongly recommended you wait in your doctor's office for 30 minutes after your injections. Your allergist is trained to watch for reactions, and his or her staff is trained and equipped with the proper medications to identify and treat them.   Report to the nurse immediately if you experience any of the following symptoms: swelling, itching or redness of the skin, hives, watery eyes/nose, breathing difficulty,  excessive sneezing, coughing, stomach pain, diarrhea, or light headedness. These symptoms may occur within 15-20 minutes after injection and may require medication.   Who Should Administer Allergy Shots?  The preferred location for receiving shots is your prescribing allergist's office. Injections can sometimes be given at another facility where the physician and staff are trained to recognize and treat reactions, and have received instructions by your prescribing allergist.  What if I am late for an injection?   Injection dose will be adjusted depending upon how many days or weeks you are late for your injection.   What if I am sick?   Please report any illness to the nurse before receiving injections. She may adjust your dose or postpone injections depending on your symptoms. If you have fever, flu, sinus infection or chest congestion it is best to  postpone allergy injections until you are better. Never get an allergy injection if your asthma is causing you problems. If your symptoms persist, seek out medical care to get your health problem under control.  What If I am or Become Pregnant:  Women that become pregnant should schedule an appointment with [facility] before receiving any further allergy injections.

## 2022-06-20 NOTE — Progress Notes (Signed)
NEW PATIENT  Date of Service/Encounter:  06/20/22  Consult requested by: Terry Anger, MD   Assessment:   Moderate persistent asthma, uncomplicated   History of mold exposure - without sensitization to molds on skin testing  Seasonal and perennial allergic rhinitis (grasses, ragweed, weeds, trees, dust mites, cat, and dog)  Plan/Recommendations:   1. Moderate persistent asthma, uncomplicated - Lung testing was in the 30% range, but it did improve with the albuterol treatment to 88%. - I am not sure whether this is more related to the actual improvement with the nebulizer versus difficulty doing the test initially, but it could be a combination of the 2. - We are going to start you on a daily medication to treat asthma that contains an inhaled steroid and a long-acting albuterol called Breo. - Daily controller medication(s): Breo 200/8mg one puff once daily - Prior to physical activity: albuterol 2 puffs 10-15 minutes before physical activity. - Rescue medications: albuterol 4 puffs every 4-6 hours as needed - Asthma control goals:  * Full participation in all desired activities (may need albuterol before activity) * Albuterol use two time or less a week on average (not counting use with activity) * Cough interfering with sleep two time or less a month * Oral steroids no more than once a year * No hospitalizations  2. Chronic rhinitis - Testing today showed: grasses, ragweed, weeds, trees, dust mites, cat, and dog - Copy of test results provided.  - Avoidance measures provided. - Molds were all negative, but there is a possibility that you were reacting to the sheer amount of spores (where they were so concentrated that anyone would react to them, even if they were not allergic to molds). - It seems that your symptoms are better controlled in your new environment, anyway, so that is good news.  - Stop taking:  - Continue with:  - Start taking: Zyrtec (cetirizine)  '10mg'$  tablet once daily and Singulair (montelukast) '10mg'$  daily - Singulair can cause irritability and bad dreams, although most people tolerate it just fine. - If this happens, stop the medication and give Terry West call. - You can use an extra dose of the antihistamine, if needed, for breakthrough symptoms.  - Consider nasal saline rinses 1-2 times daily to remove allergens from the nasal cavities as well as help with mucous clearance (this is especially helpful to do before the nasal sprays are given) - Consider allergy shots as a means of long-term control. - Strongly consider shots "re-train" and "reset" the immune system to ignore environmental allergens and decrease the resulting immune response to those allergens (sneezing, itchy watery eyes, runny nose, nasal congestion, etc).    - Allergy shots improve symptoms in 75-85% of patients.  - We can discuss more at the next appointment if the medications are not working for you.  3. Return in about 6 weeks (around 08/01/2022). You can have the follow up appointment with Dr. GErnst Bowleror a Terry West (our Terry Practitioners are excellent and always have Physician oversight!).      This note in its entirety was forwarded to the Provider who requested this consultation.  Subjective:   Terry West a 27y.o. male presenting today for evaluation of  Chief Complaint  Patient presents with   Other    Testing for mold pt states he thinks he may have a mold allergic pt having shortness of breath tightness in chest and nasal drip and sometimes a stomach ache.  Terry West has a history of the following: Patient Active Problem List   Diagnosis Date Noted   Acute pain of right knee 05/31/2017   Chronic left shoulder pain 05/31/2017   Post concussion syndrome 05/31/2017   Erectile dysfunction 11/18/2015   URI (upper respiratory infection) 08/27/2015   Patient exposure to body fluids 01/06/2015   Pes planus 06/20/2013   Left  wrist pain 06/06/2013   Pain in joint, ankle and foot 06/06/2013   LBP (low back pain) 06/06/2013   Well adult exam 11/27/2012   Headache 11/27/2012   Contusion of wrist, left 11/27/2012   Dyspepsia 09/27/2012   Unspecified constipation 09/27/2012   Testicle lump 09/27/2012   Acute thoracic back pain 09/19/2011   Anxiety disorder 09/19/2011   Epistaxis 09/19/2011   SCOLIOSIS 10/04/2009   SEBORRHEIC CAPITIS 08/25/2009   GERD 11/30/2008   OSGOOD SCHLATTER'S DISEASE 11/30/2008   ALLERGIC RHINITIS 07/30/2008   Asthma 07/30/2008    History obtained from: chart review and patient.  Terry West was referred by Terry West, Terry Lacks, MD.     Terry West is a 27 y.o. male presenting for an evaluation of concern for mold exposure .   Asthma/Respiratory Symptom History: He has an albuterol inhaler in the distant past. He was exposed to mold where he used to live. They are a bit better since moving. He reports that around the end of November, he started having SOB and tightness in his chest. On December 8th, he went to Urgent Care and was treated as a viral URI. He went back on January 4th and another time after that. He got some medications  including prednisone, which he felt helped somewhat or not at all. He did antibiotics which did not help.   He did not have asthma before this. He moved into this place around the last of March 2023. He had mold in the ventilation which spread throughout the house. When he turned the heat on, this is when he started feeling sick. He was fine with the [facility], however. He never had any pulmonary issues beofre.   He had two CXR that demonstrated bronchitic changes in January. He had a CXR in early January 2024 that was normal.  He has never had a chest CT.  Allergic Rhinitis Symptom History: He had a constant postnasal drip and throat clearing. He reports that he was "fatigued" and overall feeling drained. He did not feel that he was himself at all. He was having  trouble with simple physical activities.  He thinks that he might have had testing done when he was a child, but he does not remember the results.  Otherwise, there is no history of other atopic diseases, including drug allergies, stinging insect allergies, or contact dermatitis. There is no significant infectious history. Vaccinations are up to date.    Past Medical History: Patient Active Problem List   Diagnosis Date Noted   Acute pain of right knee 05/31/2017   Chronic left shoulder pain 05/31/2017   Post concussion syndrome 05/31/2017   Erectile dysfunction 11/18/2015   URI (upper respiratory infection) 08/27/2015   Patient exposure to body fluids 01/06/2015   Pes planus 06/20/2013   Left wrist pain 06/06/2013   Pain in joint, ankle and foot 06/06/2013   LBP (low back pain) 06/06/2013   Well adult exam 11/27/2012   Headache 11/27/2012   Contusion of wrist, left 11/27/2012   Dyspepsia 09/27/2012   Unspecified constipation 09/27/2012   Testicle lump 09/27/2012  Acute thoracic back pain 09/19/2011   Anxiety disorder 09/19/2011   Epistaxis 09/19/2011   SCOLIOSIS 10/04/2009   SEBORRHEIC CAPITIS 08/25/2009   GERD 11/30/2008   OSGOOD SCHLATTER'S DISEASE 11/30/2008   ALLERGIC RHINITIS 07/30/2008   Asthma 07/30/2008    Medication List:  Allergies as of 06/20/2022   No Known Allergies      Medication List        Accurate as of June 20, 2022  1:22 PM. If you have any questions, ask your Terry or doctor.          cetirizine 10 MG tablet Commonly known as: ZYRTEC Take 1 tablet (10 mg total) by mouth daily.   fluticasone furoate-vilanterol 200-25 MCG/ACT Aepb Commonly known as: Breo Ellipta Inhale 1 puff into the lungs daily for 28 days. Started by: Valentina Shaggy, MD   ibuprofen 800 MG tablet Commonly known as: ADVIL Take 1 tablet (800 mg total) by mouth every 8 (eight) hours as needed (pain).   montelukast 10 MG tablet Commonly known as:  Singulair Take 1 tablet (10 mg total) by mouth at bedtime. Started by: Valentina Shaggy, MD   Olopatadine HCl 0.2 % Soln Apply 1 drop to eye daily.   predniSONE 10 MG (21) Tbpk tablet Commonly known as: STERAPRED UNI-PAK 21 TAB Take by mouth daily. Take as directed.        Birth History: non-contributory  Developmental History: non-contributory  Past Surgical History: Past Surgical History:  Procedure Laterality Date   WISDOM TOOTH EXTRACTION       Family History: Family History  Problem Relation Age of Onset   Thyroid disease Mother    Heart disease Mother    Healthy Father      Social History: Ezequel lives at home in house.  The house, which he rents, is 68+ years old.  There is wood throughout the home.  They have gas heating and central cooling.  There is a dog inside of the home.  There are dust mite covers on the bed as well as the pillows.  There is no tobacco exposure.  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  There is no fume, chemical, or dust exposure.  There is no HEPA filter in the home.  They do not live near an interstate or industrial area.   Review of Systems  Constitutional: Negative.  Negative for chills, fever, malaise/fatigue and weight loss.  HENT: Negative.  Negative for congestion, ear discharge and ear pain.   Eyes:  Negative for pain, discharge and redness.  Respiratory:  Negative for cough, sputum production, shortness of breath and wheezing.   Cardiovascular: Negative.  Negative for chest pain and palpitations.  Gastrointestinal:  Negative for abdominal pain, constipation, diarrhea, heartburn, nausea and vomiting.  Skin: Negative.  Negative for itching and rash.  Neurological:  Negative for dizziness and headaches.  Endo/Heme/Allergies:  Negative for environmental allergies. Does not bruise/bleed easily.       Objective:   Blood pressure 130/72, pulse 88, temperature 98 F (36.7 C),  resp. rate 20, height '6\' 1"'$  (1.854 m), weight 209 lb (94.8 kg), SpO2 99 %. Body mass index is 27.57 kg/m.     Physical Exam Vitals reviewed.  Constitutional:      Appearance: He is well-developed.  HENT:     Head: Normocephalic and atraumatic.     Right Ear: Tympanic membrane, ear canal and external ear normal. No drainage, swelling or tenderness. Tympanic  membrane is not injected, scarred, erythematous, retracted or bulging.     Left Ear: Tympanic membrane, ear canal and external ear normal. No drainage, swelling or tenderness. Tympanic membrane is not injected, scarred, erythematous, retracted or bulging.     Nose: No nasal deformity, septal deviation, mucosal edema or rhinorrhea.     Right Turbinates: Enlarged, swollen and pale.     Left Turbinates: Enlarged, swollen and pale.     Right Sinus: No maxillary sinus tenderness or frontal sinus tenderness.     Left Sinus: No maxillary sinus tenderness or frontal sinus tenderness.     Comments: No nasal polyps.    Mouth/Throat:     Lips: Pink.     Mouth: Mucous membranes are moist. Mucous membranes are not pale and not dry.     Pharynx: Uvula midline.     Comments: Moderate cobblestoning. Eyes:     General: Allergic shiner present.        Right eye: No discharge.        Left eye: No discharge.     Conjunctiva/sclera: Conjunctivae normal.     Right eye: Right conjunctiva is not injected. No chemosis.    Left eye: Left conjunctiva is not injected. No chemosis.    Pupils: Pupils are equal, round, and reactive to light.  Cardiovascular:     Rate and Rhythm: Normal rate and regular rhythm.     Heart sounds: Normal heart sounds.  Pulmonary:     Effort: Pulmonary effort is normal. No tachypnea, accessory muscle usage or respiratory distress.     Breath sounds: Examination of the right-upper field reveals wheezing. Examination of the left-upper field reveals wheezing. Examination of the right-middle field reveals decreased breath sounds  and wheezing. Examination of the left-middle field reveals decreased breath sounds and wheezing. Examination of the right-lower field reveals decreased breath sounds. Examination of the left-lower field reveals decreased breath sounds. Decreased breath sounds and wheezing present. No rhonchi or rales.  Chest:     Chest wall: No tenderness.  Abdominal:     Tenderness: There is no abdominal tenderness. There is no guarding or rebound.  Lymphadenopathy:     Head:     Right side of head: No submandibular, tonsillar or occipital adenopathy.     Left side of head: No submandibular, tonsillar or occipital adenopathy.     Cervical: No cervical adenopathy.  Skin:    Coloration: Skin is not pale.     Findings: No abrasion, erythema, petechiae or rash. Rash is not papular, urticarial or vesicular.  Neurological:     Mental Status: He is alert.  Psychiatric:        Behavior: Behavior is cooperative.      Diagnostic studies:    Spirometry: results abnormal (FEV1: 1.49/35%, FVC: 3.72/73%, FEV1/FVC: 40%).    Spirometry consistent with severe obstructive disease.  Xopenex nebulizer  treatment given in clinic with significant improvement in FEV1 and FVC per ATS criteria.  His FEV1 increased to 88% and his FVC increased to 85%.  Allergy Studies:    Airborne Adult Perc - 06/20/22 1026     Time Antigen Placed 1026    Allergen Manufacturer Lavella Hammock    Location Back    Number of Test 59    1. Control-Buffer 50% Glycerol Negative    2. Control-Histamine 1 mg/ml 2+    3. Albumin saline Negative    4. [place] Negative    5. [place] 3+    6. [place] Negative  7. [place] Blue 3+    8. Meadow Fescue 3+    9. Perennial Rye 3+    10. Sweet Vernal 3+    11. Timothy 3+    12. Cocklebur Negative    13. Burweed Marshelder Negative    14. Ragweed, short 3+    15. Ragweed, Giant 2+    16. Plantain,  English 2+    17. Lamb's Quarters 2+    18. Sheep Sorrell Negative    19. Rough Pigweed 3+    20. Marsh  Elder, Rough 3+    21. Mugwort, Common Negative    22. Ash mix 3+    23. Birch mix 2+    24. Beech American 2+    25. Box, Elder 2+    26. Cedar, red Negative    27. Cottonwood, [place] Negative    28. Elm mix 2+    29. Hickory 3+    30. Maple mix 2+    31. Oak, [place] mix 4+    32. Pecan Pollen 3+    33. Pine mix Negative    34. Sycamore Eastern Negative    35. Moody, Black Pollen 2+    36. Alternaria alternata Negative    37. Cladosporium Herbarum Negative    38. Aspergillus mix Negative    39. Penicillium mix Negative    40. Bipolaris sorokiniana (Helminthosporium) Negative    41. Drechslera spicifera (Curvularia) Negative    42. Mucor plumbeus Negative    43. Fusarium moniliforme Negative    44. Aureobasidium pullulans (pullulara) Negative    45. Rhizopus oryzae Negative    46. Botrytis cinera Negative    47. Epicoccum nigrum Negative    48. Phoma betae Negative    49. Candida Albicans Negative    50. Trichophyton mentagrophytes Negative    51. Mite, D Farinae  5,000 AU/ml 3+    52. Mite, D Pteronyssinus  5,000 AU/ml Negative    53. Cat Hair 10,000 BAU/ml 3+    54.  Dog Epithelia 2+    55. Mixed Feathers Negative    56. Horse Epithelia Negative    57. Cockroach, German Negative    58. Mouse Negative    59. Tobacco Leaf Negative             Intradermal - 06/20/22 1101     Time Antigen Placed 1105    Allergen Manufacturer Lavella Hammock    Location Arm    Number of Test 6    Control Negative    Mold 1 Negative    Mold 2 Negative    Mold 3 Negative    Mold 4 Negative    Cockroach Negative             Allergy testing results were read and interpreted by myself, documented by clinical staff.         Salvatore Marvel, MD [facility] [place]

## 2022-06-21 ENCOUNTER — Telehealth: Payer: Self-pay

## 2022-06-21 ENCOUNTER — Other Ambulatory Visit (HOSPITAL_COMMUNITY): Payer: Self-pay

## 2022-06-21 MED ORDER — BUDESONIDE-FORMOTEROL FUMARATE 160-4.5 MCG/ACT IN AERO: 2.0000 | INHALATION_SPRAY | Freq: Two times a day (BID) | RESPIRATORY_TRACT | 5 refills | Status: DC

## 2022-06-21 NOTE — Telephone Encounter (Signed)
Patient called to see what was going on with the rx [phone]

## 2022-06-21 NOTE — Telephone Encounter (Signed)
We can do Symbicort 133mg two puffs BID.   JSalvatore Marvel MD [facility] [place]

## 2022-06-21 NOTE — Telephone Encounter (Signed)
PA request received via CMM for Fluticasone Furoate-Vilanterol 200-25MCG/ACT aerosol powder  PA not submitted due to covered alternatives seemingly not tried previously.  Key: [id number]

## 2022-06-21 NOTE — Telephone Encounter (Signed)
Symbicort 178mg two puffs BID has been sent in and patient verified the pharmacy. Patient will call if he has issues with cost, but this inhaler looks to be approved by his insurance.

## 2022-06-21 NOTE — Telephone Encounter (Signed)
Do you want to try and switch to any of these alternatives, or continue with PA?

## 2022-06-22 ENCOUNTER — Other Ambulatory Visit: Payer: Self-pay | Admitting: *Deleted

## 2022-06-22 ENCOUNTER — Telehealth: Payer: Self-pay

## 2022-06-22 ENCOUNTER — Other Ambulatory Visit (HOSPITAL_COMMUNITY): Payer: Self-pay

## 2022-06-22 MED ORDER — SYMBICORT 160-4.5 MCG/ACT IN AERO: 2.0000 | INHALATION_SPRAY | Freq: Two times a day (BID) | RESPIRATORY_TRACT | 5 refills | Status: DC

## 2022-06-22 NOTE — Telephone Encounter (Signed)
Patient Advocate Encounter   Received notification from [facility] that prior authorization is required for Budesonide-Formoterol Fumarate 160-4.5MCG/ACT aerosol  Submitted: n/a [id number]   Prior authorization is not submitted at this time. Insurance covers this if run as brand Symbicort instead of generic

## 2022-06-22 NOTE — Telephone Encounter (Signed)
Medication has been sent back in to pharmacy dispensed as Brand Name Symbicort.

## 2022-08-10 ENCOUNTER — Other Ambulatory Visit: Payer: Self-pay

## 2022-08-10 ENCOUNTER — Ambulatory Visit (INDEPENDENT_AMBULATORY_CARE_PROVIDER_SITE_OTHER): Payer: Medicaid Other | Admitting: Allergy & Immunology

## 2022-08-10 ENCOUNTER — Encounter: Payer: Self-pay | Admitting: Allergy & Immunology

## 2022-08-10 VITALS — BP 124/86 | HR 58 | Temp 98.3°F | Resp 18 | Ht 71.0 in | Wt 209.0 lb

## 2022-08-10 DIAGNOSIS — J302 Other seasonal allergic rhinitis: Secondary | ICD-10-CM | POA: Diagnosis not present

## 2022-08-10 DIAGNOSIS — J4540 Moderate persistent asthma, uncomplicated: Secondary | ICD-10-CM

## 2022-08-10 DIAGNOSIS — J3089 Other allergic rhinitis: Secondary | ICD-10-CM

## 2022-08-10 MED ORDER — CETIRIZINE HCL 10 MG PO TABS: 10.0000 mg | ORAL_TABLET | Freq: Every day | ORAL | 1 refills | Status: AC

## 2022-08-10 MED ORDER — TRELEGY ELLIPTA 200-62.5-25 MCG/ACT IN AEPB: 1.0000 | INHALATION_SPRAY | Freq: Every day | RESPIRATORY_TRACT | 5 refills | Status: AC

## 2022-08-10 MED ORDER — VENTOLIN HFA 108 (90 BASE) MCG/ACT IN AERS: 2.0000 | INHALATION_SPRAY | RESPIRATORY_TRACT | 1 refills | Status: AC | PRN

## 2022-08-10 MED ORDER — MONTELUKAST SODIUM 10 MG PO TABS: 10.0000 mg | ORAL_TABLET | Freq: Every day | ORAL | 1 refills | Status: AC

## 2022-08-10 NOTE — Patient Instructions (Addendum)
1. Moderate persistent asthma, uncomplicated - Lung testing was in the high 60% range which is definitely better, but not normal for a 27 year old. - We will see how you are doing in two months and hopefully your numbers are totally normal.  - We are going to send in Trelegy which is one puff once daily.  - It looks like this is covered by Medicaid, but call us if this is not a problem.  - Daily controller medication(s): Trelegy 200/62.5/25 one puff once daily - Prior to physical activity: albuterol 2 puffs 10-15 minutes before physical activity. - Rescue medications: albuterol 4 puffs every 4-6 hours as needed - Asthma control goals:  * Full participation in all desired activities (may need albuterol before activity) * Albuterol use two time or less a week on average (not counting use with activity) * Cough interfering with sleep two time or less a month * Oral steroids no more than once a year * No hospitalizations  2. Chronic rhinitis - Previous today showed: grasses, ragweed, weeds, trees, dust mites, cat, and dog - Continue taking: Zyrtec (cetirizine) 10mg  tablet once daily and Singulair (montelukast) 10mg  daily - Singulair can cause irritability and bad dreams, although most people tolerate it just fine. - If this happens, stop the medication and give [place] a call. - You can use an extra dose of the antihistamine, if needed, for breakthrough symptoms.  - Consider nasal saline rinses 1-2 times daily to remove allergens from the nasal cavities as well as help with mucous clearance (this is especially helpful to do before the nasal sprays are given) - Consider allergy shots as a means of long-term control. - Strongly consider shots "re-train" and "reset" the immune system to ignore environmental allergens and decrease the resulting immune response to those allergens (sneezing, itchy watery eyes, runny nose, nasal congestion, etc).    - Allergy shots improve symptoms in 75-85% of patients.    3. Return in about 2 months (around 10/10/2022). You can have the follow up appointment with Dr. Dellis Anes or a Nurse Practicioner (our Nurse Practitioners are excellent and always have Physician oversight!).    Please inform us of any Emergency Department visits, hospitalizations, or changes in symptoms. Call us before going to the ED for breathing or allergy symptoms since we might be able to fit you in for a sick visit. Feel free to contact us anytime with any questions, problems, or concerns.  It was a pleasure to meet you today!  Websites that have reliable patient information: 1. American Academy of Asthma, Allergy, and Immunology: www.aaaai.org 2. Food Allergy Research and Education (FARE): foodallergy.org 3. Mothers of Asthmatics: http://www.asthmacommunitynetwork.org 4. American College of Allergy, Asthma, and Immunology: www.acaai.org   COVID-19 Vaccine Information can be found at: [email or web address] For questions related to vaccine distribution or appointments, please email [email or web address] or call [phone].   We realize that you might be concerned about having an allergic reaction to the COVID19 vaccines. To help with that concern, WE ARE OFFERING THE COVID19 VACCINES IN OUR OFFICE! Ask the front desk for dates!     "Like" [place] on Facebook and Instagram for our latest updates!      A healthy democracy works best when [employer] participate! Make sure you are registered to vote! If you have moved or changed any of your contact information, you will need to get this updated before voting!  In some cases, you MAY be able to register to vote online: [email or web address]

## 2022-08-10 NOTE — Progress Notes (Signed)
FOLLOW UP  Date of Service/Encounter:  08/10/22   Assessment:   Moderate persistent asthma, uncomplicated    History of mold exposure - without sensitization to molds on skin testing   Seasonal and perennial allergic rhinitis (grasses, ragweed, weeds, trees, dust mites, cat, and dog)  Plan/Recommendations:   1. Moderate persistent asthma, uncomplicated - Lung testing was in the high 60% range which is definitely better, but not normal for a 27 year old. - We will see how you are doing in two months and hopefully your numbers are totally normal.  - We are going to send in Trelegy which is one puff once daily.  - It looks like this is covered by Medicaid, but call us if this is not a problem.  - Daily controller medication(s): Trelegy 200/62.5/25 one puff once daily - Prior to physical activity: albuterol 2 puffs 10-15 minutes before physical activity. - Rescue medications: albuterol 4 puffs every 4-6 hours as needed - Asthma control goals:  * Full participation in all desired activities (may need albuterol before activity) * Albuterol use two time or less a week on average (not counting use with activity) * Cough interfering with sleep two time or less a month * Oral steroids no more than once a year * No hospitalizations  2. Chronic rhinitis - Previous today showed: grasses, ragweed, weeds, trees, dust mites, cat, and dog - Continue taking: Zyrtec (cetirizine) 10mg  tablet once daily and Singulair (montelukast) 10mg  daily - Singulair can cause irritability and bad dreams, although most people tolerate it just fine. - If this happens, stop the medication and give [place] a call. - You can use an extra dose of the antihistamine, if needed, for breakthrough symptoms.  - Consider nasal saline rinses 1-2 times daily to remove allergens from the nasal cavities as well as help with mucous clearance (this is especially helpful to do before the nasal sprays are given) - Consider allergy  shots as a means of long-term control. - Strongly consider shots "re-train" and "reset" the immune system to ignore environmental allergens and decrease the resulting immune response to those allergens (sneezing, itchy watery eyes, runny nose, nasal congestion, etc).    - Allergy shots improve symptoms in 75-85% of patients.   3. Return in about 2 months (around 10/10/2022). You can have the follow up appointment with Dr. Dellis West or a Nurse Practicioner (our Nurse Practitioners are excellent and always have Physician oversight!).   Subjective:   Terry West is a 27 y.o. male presenting today for follow up of  Chief Complaint  Patient presents with   Asthma    Has been better     Terry West has a history of the following: Patient Active Problem List   Diagnosis Date Noted   Acute pain of right knee 05/31/2017   Chronic left shoulder pain 05/31/2017   Post concussion syndrome 05/31/2017   Erectile dysfunction 11/18/2015   URI (upper respiratory infection) 08/27/2015   Patient exposure to body fluids 01/06/2015   Pes planus 06/20/2013   Left wrist pain 06/06/2013   Pain in joint, ankle and foot 06/06/2013   LBP (low back pain) 06/06/2013   Well adult exam 11/27/2012   Headache 11/27/2012   Contusion of wrist, left 11/27/2012   Dyspepsia 09/27/2012   Unspecified constipation 09/27/2012   Testicle lump 09/27/2012   Acute thoracic back pain 09/19/2011   Anxiety disorder 09/19/2011   Epistaxis 09/19/2011   SCOLIOSIS 10/04/2009   SEBORRHEIC CAPITIS 08/25/2009  GERD 11/30/2008   OSGOOD SCHLATTER'S DISEASE 11/30/2008   ALLERGIC RHINITIS 07/30/2008   Asthma 07/30/2008    History obtained from: chart review and patient.  Terry West is a 27 y.o. male presenting for a follow up visit.  He was last seen in March 2024.  At that time, his lung testing was in the 30% range but improved to 88% following an albuterol treatment.  We decided to start him on Breo 1 puff once daily.   For his rhinitis, he was positive to multiple indoor and outdoor allergens.  We started him on Zyrtec and Singulair since he was not very excited about no sprays.  We did discuss allergy shots for long-term control.  Since the last visit, he has done well.   Asthma/Respiratory Symptom History: Terry West was not covered.  We sent in [place] instead and it was $8. He has not been using the Breztri two puffs twice daily, typically it is just once daily.  He just forgets to use something twice a day, but he does feel improvement when he does use it more consistently.  He works some weird shifts, so he does not have a real routine established right now. Terry West's asthma has been well controlled. He has not required rescue medication, experienced nocturnal awakenings due to lower respiratory symptoms, nor have activities of daily living been limited. He has required no Emergency Department or Urgent Care visits for his asthma. He has required zero courses of systemic steroids for asthma exacerbations since the last visit. ACT score today is 25, indicating excellent asthma symptom control.   Allergic Rhinitis Symptom History: He has been using cetirizine daily. He has not needed prednisone at all since the last visit.  Allergies are worse at work because he works outside. He was having some issues with his house with the mold present there. He ended up moving which helped a lot.  He does not like no sprays at all.  Otherwise, there have been no changes to his past medical history, surgical history, family history, or social history.    Review of Systems  Constitutional: Negative.  Negative for chills, fever, malaise/fatigue and weight loss.  HENT:  Positive for congestion. Negative for ear discharge, ear pain and sinus pain.   Eyes:  Negative for pain, discharge and redness.  Respiratory:  Negative for cough, sputum production, shortness of breath and wheezing.   Cardiovascular: Negative.  Negative for chest  pain and palpitations.  Gastrointestinal:  Negative for abdominal pain, constipation, diarrhea, heartburn, nausea and vomiting.  Skin: Negative.  Negative for itching and rash.  Neurological:  Negative for dizziness and headaches.  Endo/Heme/Allergies:  Positive for environmental allergies. Does not bruise/bleed easily.       Objective:   Blood pressure 124/86, pulse (!) 58, temperature 98.3 F (36.8 C), resp. rate 18, height 5\' 11"  (1.803 m), weight 209 lb (94.8 kg), SpO2 98 %. Body mass index is 29.15 kg/m.    Physical Exam Vitals reviewed.  Constitutional:      Appearance: He is well-developed.  HENT:     Head: Normocephalic and atraumatic.     Right Ear: Tympanic membrane, ear canal and external ear normal. No drainage, swelling or tenderness. Tympanic membrane is not injected, scarred, erythematous, retracted or bulging.     Left Ear: Tympanic membrane, ear canal and external ear normal. No drainage, swelling or tenderness. Tympanic membrane is not injected, scarred, erythematous, retracted or bulging.     Nose: No nasal deformity, septal deviation, mucosal  edema or rhinorrhea.     Right Turbinates: Enlarged, swollen and pale.     Left Turbinates: Enlarged, swollen and pale.     Right Sinus: No maxillary sinus tenderness or frontal sinus tenderness.     Left Sinus: No maxillary sinus tenderness or frontal sinus tenderness.     Comments: No nasal polyps.    Mouth/Throat:     Lips: Pink.     Mouth: Mucous membranes are moist. Mucous membranes are not pale and not dry.     Pharynx: Uvula midline.     Comments: Moderate cobblestoning. Eyes:     General: Allergic shiner present.        Right eye: No discharge.        Left eye: No discharge.     Conjunctiva/sclera: Conjunctivae normal.     Right eye: Right conjunctiva is not injected. No chemosis.    Left eye: Left conjunctiva is not injected. No chemosis.    Pupils: Pupils are equal, round, and reactive to light.   Cardiovascular:     Rate and Rhythm: Normal rate and regular rhythm.     Heart sounds: Normal heart sounds.  Pulmonary:     Effort: Pulmonary effort is normal. No tachypnea, accessory muscle usage or respiratory distress.     Breath sounds: Examination of the right-upper field reveals wheezing. Examination of the left-upper field reveals wheezing. Examination of the right-middle field reveals decreased breath sounds and wheezing. Examination of the left-middle field reveals decreased breath sounds and wheezing. Examination of the right-lower field reveals decreased breath sounds. Examination of the left-lower field reveals decreased breath sounds. Decreased breath sounds and wheezing present. No rhonchi or rales.  Chest:     Chest wall: No tenderness.  Lymphadenopathy:     Head:     Right side of head: No submandibular, tonsillar or occipital adenopathy.     Left side of head: No submandibular, tonsillar or occipital adenopathy.     Cervical: No cervical adenopathy.  Skin:    Coloration: Skin is not pale.     Findings: No abrasion, erythema, petechiae or rash. Rash is not papular, urticarial or vesicular.  Neurological:     Mental Status: He is alert.  Psychiatric:        Behavior: Behavior is cooperative.      Diagnostic studies:    Spirometry: results normal (FEV1: 2.82/71%, FVC: 4.12/86%, FEV1/FVC: 68%).    Spirometry consistent with moderate obstructive disease. This was better than his initial one obtained at the last visit.   Allergy Studies: none        Malachi Bonds, MD  [facility]

## 2022-08-13 ENCOUNTER — Encounter: Payer: Self-pay | Admitting: Allergy & Immunology

## 2022-10-28 ENCOUNTER — Emergency Department (HOSPITAL_COMMUNITY): Payer: Medicaid Other

## 2022-10-28 ENCOUNTER — Encounter (HOSPITAL_COMMUNITY): Payer: Self-pay | Admitting: Emergency Medicine

## 2022-10-28 ENCOUNTER — Emergency Department (HOSPITAL_COMMUNITY)
Admission: EM | Admit: 2022-10-28 | Discharge: 2022-10-28 | Disposition: A | Discharge status: Home / Self Care | Payer: Medicaid Other | Source: Home / Self Care | Attending: Emergency Medicine | Admitting: Emergency Medicine

## 2022-10-28 ENCOUNTER — Emergency Department (HOSPITAL_COMMUNITY)
Admission: EM | Admit: 2022-10-28 | Discharge: 2022-10-28 | Disposition: A | Discharge status: Home / Self Care | Payer: Medicaid Other | Attending: Emergency Medicine | Admitting: Emergency Medicine

## 2022-10-28 ENCOUNTER — Other Ambulatory Visit: Payer: Self-pay

## 2022-10-28 DIAGNOSIS — S61511A Laceration without foreign body of right wrist, initial encounter: Secondary | ICD-10-CM | POA: Diagnosis not present

## 2022-10-28 DIAGNOSIS — Z23 Encounter for immunization: Secondary | ICD-10-CM | POA: Diagnosis not present

## 2022-10-28 DIAGNOSIS — S51801D Unspecified open wound of right forearm, subsequent encounter: Secondary | ICD-10-CM | POA: Insufficient documentation

## 2022-10-28 DIAGNOSIS — W3400XD Accidental discharge from unspecified firearms or gun, subsequent encounter: Secondary | ICD-10-CM | POA: Insufficient documentation

## 2022-10-28 DIAGNOSIS — S81012A Laceration without foreign body, left knee, initial encounter: Secondary | ICD-10-CM | POA: Diagnosis not present

## 2022-10-28 DIAGNOSIS — Z5189 Encounter for other specified aftercare: Secondary | ICD-10-CM

## 2022-10-28 DIAGNOSIS — S81002D Unspecified open wound, left knee, subsequent encounter: Secondary | ICD-10-CM | POA: Insufficient documentation

## 2022-10-28 DIAGNOSIS — W3400XA Accidental discharge from unspecified firearms or gun, initial encounter: Secondary | ICD-10-CM | POA: Diagnosis not present

## 2022-10-28 DIAGNOSIS — S59911A Unspecified injury of right forearm, initial encounter: Secondary | ICD-10-CM | POA: Diagnosis present

## 2022-10-28 DIAGNOSIS — Z4800 Encounter for change or removal of nonsurgical wound dressing: Secondary | ICD-10-CM | POA: Insufficient documentation

## 2022-10-28 DIAGNOSIS — S51841A Puncture wound with foreign body of right forearm, initial encounter: Secondary | ICD-10-CM | POA: Insufficient documentation

## 2022-10-28 LAB — PROTIME-INR
INR: 1 (ref 0.8–1.2)
Prothrombin Time: 13.4 seconds (ref 11.4–15.2)

## 2022-10-28 LAB — CBC
HCT: 46 % (ref 39.0–52.0)
Hemoglobin: 14.6 g/dL (ref 13.0–17.0)
MCH: 28.7 pg (ref 26.0–34.0)
MCHC: 31.7 g/dL (ref 30.0–36.0)
MCV: 90.4 fL (ref 80.0–100.0)
Platelets: 294 10*3/uL (ref 150–400)
RBC: 5.09 MIL/uL (ref 4.22–5.81)
RDW: 12.8 % (ref 11.5–15.5)
WBC: 14.8 10*3/uL — ABNORMAL HIGH (ref 4.0–10.5)
nRBC: 0 % (ref 0.0–0.2)

## 2022-10-28 LAB — COMPREHENSIVE METABOLIC PANEL
ALT: 29 U/L (ref 0–44)
AST: 30 U/L (ref 15–41)
Albumin: 4.1 g/dL (ref 3.5–5.0)
Alkaline Phosphatase: 42 U/L (ref 38–126)
Anion gap: 15 (ref 5–15)
BUN: 10 mg/dL (ref 6–20)
CO2: 21 mmol/L — ABNORMAL LOW (ref 22–32)
Calcium: 9.3 mg/dL (ref 8.9–10.3)
Chloride: 102 mmol/L (ref 98–111)
Creatinine, Ser: 1.05 mg/dL (ref 0.61–1.24)
GFR, Estimated: >60 mL/min (ref >60)
Glucose, Bld: 168 mg/dL — ABNORMAL HIGH (ref 70–99)
Potassium: 3.5 mmol/L (ref 3.5–5.1)
Sodium: 138 mmol/L (ref 135–145)
Total Bilirubin: 0.6 mg/dL (ref 0.3–1.2)
Total Protein: 7.4 g/dL (ref 6.5–8.1)

## 2022-10-28 LAB — I-STAT CHEM 8, ED
BUN: 10 mg/dL (ref 6–20)
Calcium, Ion: 1.09 mmol/L — ABNORMAL LOW (ref 1.15–1.40)
Chloride: 106 mmol/L (ref 98–111)
Creatinine, Ser: 1 mg/dL (ref 0.61–1.24)
Glucose, Bld: 170 mg/dL — ABNORMAL HIGH (ref 70–99)
HCT: 44 % (ref 39.0–52.0)
Hemoglobin: 15 g/dL (ref 13.0–17.0)
Potassium: 3.5 mmol/L (ref 3.5–5.1)
Sodium: 139 mmol/L (ref 135–145)
TCO2: 21 mmol/L — ABNORMAL LOW (ref 22–32)

## 2022-10-28 LAB — SAMPLE TO BLOOD BANK

## 2022-10-28 LAB — I-STAT CG4 LACTIC ACID, ED: Lactic Acid, Venous: 2.9 mmol/L (ref 0.5–1.9)

## 2022-10-28 LAB — LACTIC ACID, PLASMA: Lactic Acid, Venous: 2.9 mmol/L (ref 0.5–1.9)

## 2022-10-28 LAB — ETHANOL: Alcohol, Ethyl (B): <10 mg/dL (ref <10)

## 2022-10-28 MED ORDER — ONDANSETRON HCL 4 MG/2ML IJ SOLN
4.0000 mg | Freq: Once | INTRAMUSCULAR | Status: AC
  Administered 2022-10-28: 4 mg via INTRAVENOUS
  Filled 2022-10-28: qty 2

## 2022-10-28 MED ORDER — OXYCODONE-ACETAMINOPHEN 5-325 MG PO TABS
ORAL_TABLET | ORAL | Status: AC
  Administered 2022-10-28: 1
  Filled 2022-10-28: qty 1

## 2022-10-28 MED ORDER — LIDOCAINE-EPINEPHRINE (PF) 2 %-1:200000 IJ SOLN
INTRAMUSCULAR | Status: AC
  Filled 2022-10-28: qty 20

## 2022-10-28 MED ORDER — OXYCODONE-ACETAMINOPHEN 5-325 MG PO TABS
1.0000 | ORAL_TABLET | Freq: Once | ORAL | Status: AC
  Administered 2022-10-28: 1 via ORAL
  Filled 2022-10-28: qty 1

## 2022-10-28 MED ORDER — OXYCODONE-ACETAMINOPHEN 5-325 MG PO TABS: 1.0000 | ORAL_TABLET | ORAL | 0 refills | Status: DC | PRN

## 2022-10-28 MED ORDER — CEFAZOLIN SODIUM-DEXTROSE 1-4 GM/50ML-% IV SOLN
1.0000 g | Freq: Once | INTRAVENOUS | Status: AC
  Administered 2022-10-28: 1 g via INTRAVENOUS
  Filled 2022-10-28: qty 50

## 2022-10-28 MED ORDER — DOXYCYCLINE HYCLATE 100 MG PO CAPS: 100.0000 mg | ORAL_CAPSULE | Freq: Two times a day (BID) | ORAL | 0 refills | Status: DC

## 2022-10-28 MED ORDER — TETANUS-DIPHTH-ACELL PERTUSSIS 5-2.5-18.5 LF-MCG/0.5 IM SUSY
0.5000 mL | PREFILLED_SYRINGE | Freq: Once | INTRAMUSCULAR | Status: AC
  Administered 2022-10-28: 0.5 mL via INTRAMUSCULAR
  Filled 2022-10-28: qty 0.5

## 2022-10-28 MED ORDER — FENTANYL CITRATE PF 50 MCG/ML IJ SOSY
50.0000 ug | PREFILLED_SYRINGE | Freq: Once | INTRAMUSCULAR | Status: AC
  Administered 2022-10-28: 50 ug via INTRAVENOUS

## 2022-10-28 MED ORDER — FENTANYL CITRATE PF 50 MCG/ML IJ SOSY
PREFILLED_SYRINGE | INTRAMUSCULAR | Status: AC
  Filled 2022-10-28: qty 1

## 2022-10-28 MED ORDER — HYDROMORPHONE HCL 1 MG/ML IJ SOLN
1.0000 mg | Freq: Once | INTRAMUSCULAR | Status: AC
  Administered 2022-10-28: 1 mg via INTRAVENOUS
  Filled 2022-10-28: qty 1
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
## 2022-11-06 ENCOUNTER — Telehealth: Payer: Self-pay

## 2022-11-06 DIAGNOSIS — Z5941 Food insecurity: Secondary | ICD-10-CM

## 2022-11-06 NOTE — Telephone Encounter (Signed)
error 

## 2022-11-16 ENCOUNTER — Encounter: Payer: Self-pay | Admitting: Internal Medicine

## 2022-11-16 ENCOUNTER — Ambulatory Visit: Payer: Medicaid Other | Admitting: Internal Medicine

## 2022-11-16 VITALS — BP 120/82 | HR 69 | Temp 98.6°F | Ht 73.0 in | Wt 207.0 lb

## 2022-11-16 DIAGNOSIS — F329 Major depressive disorder, single episode, unspecified: Secondary | ICD-10-CM

## 2022-11-16 DIAGNOSIS — S41139A Puncture wound without foreign body of unspecified upper arm, initial encounter: Secondary | ICD-10-CM | POA: Insufficient documentation

## 2022-11-16 DIAGNOSIS — S41131D Puncture wound without foreign body of right upper arm, subsequent encounter: Secondary | ICD-10-CM

## 2022-11-16 DIAGNOSIS — S71132D Puncture wound without foreign body, left thigh, subsequent encounter: Secondary | ICD-10-CM

## 2022-11-16 DIAGNOSIS — S71132A Puncture wound without foreign body, left thigh, initial encounter: Secondary | ICD-10-CM | POA: Insufficient documentation

## 2022-11-16 MED ORDER — NAPROXEN 500 MG PO TABS: 500.0000 mg | ORAL_TABLET | Freq: Two times a day (BID) | ORAL | 2 refills | Status: DC | PRN

## 2022-11-16 MED ORDER — VITAMIN D3 50 MCG (2000 UT) PO CAPS: 2000.0000 [IU] | ORAL_CAPSULE | Freq: Every day | ORAL | 3 refills | Status: AC
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
## 2022-11-26 ENCOUNTER — Encounter: Payer: Self-pay | Admitting: Internal Medicine
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
## 2022-11-29 ENCOUNTER — Telehealth: Payer: Self-pay | Admitting: Internal Medicine

## 2022-11-29 NOTE — Telephone Encounter (Signed)
Patient called and said he spoke with [facility] about his referral. They said his referral was not for PT, but a follow up. They would like to know if it can be re-sent to say patient needs PT. Best callback is [phone].

## 2022-12-01 NOTE — Telephone Encounter (Signed)
This is correct.  Patient needs to be seen by [facility] first to assess his condition.  Then they will establish what therapy he needs and make a physical therapy referral internally. Thank you

## 2022-12-01 NOTE — Telephone Encounter (Signed)
Notified pt w/MD response.../lmb 

## 2022-12-09 ENCOUNTER — Emergency Department (HOSPITAL_COMMUNITY)
Admission: EM | Admit: 2022-12-09 | Discharge: 2022-12-10 | Disposition: A | Discharge status: Home / Self Care | Payer: Medicaid Other | Attending: Emergency Medicine | Admitting: Emergency Medicine

## 2022-12-09 ENCOUNTER — Other Ambulatory Visit: Payer: Self-pay

## 2022-12-09 DIAGNOSIS — J45909 Unspecified asthma, uncomplicated: Secondary | ICD-10-CM | POA: Insufficient documentation

## 2022-12-09 DIAGNOSIS — U071 COVID-19: Secondary | ICD-10-CM | POA: Insufficient documentation

## 2022-12-09 DIAGNOSIS — M791 Myalgia, unspecified site: Secondary | ICD-10-CM | POA: Diagnosis present

## 2022-12-09 DIAGNOSIS — Z7951 Long term (current) use of inhaled steroids: Secondary | ICD-10-CM | POA: Insufficient documentation

## 2022-12-09 LAB — RESP PANEL BY RT-PCR (RSV, FLU A&B, COVID)  RVPGX2
Influenza A by PCR: NEGATIVE
Influenza B by PCR: NEGATIVE
Resp Syncytial Virus by PCR: NEGATIVE
SARS Coronavirus 2 by RT PCR: POSITIVE — AB

## 2022-12-09 LAB — URINALYSIS, ROUTINE W REFLEX MICROSCOPIC
Bilirubin Urine: NEGATIVE
Glucose, UA: NEGATIVE mg/dL
Hgb urine dipstick: NEGATIVE
Ketones, ur: NEGATIVE mg/dL
Leukocytes,Ua: NEGATIVE
Nitrite: NEGATIVE
Protein, ur: NEGATIVE mg/dL
Specific Gravity, Urine: 1.013 (ref 1.005–1.030)
pH: 8 (ref 5.0–8.0)

## 2022-12-09 LAB — COMPREHENSIVE METABOLIC PANEL
ALT: 30 U/L (ref 0–44)
AST: 26 U/L (ref 15–41)
Albumin: 4.2 g/dL (ref 3.5–5.0)
Alkaline Phosphatase: 49 U/L (ref 38–126)
Anion gap: 9 (ref 5–15)
BUN: 6 mg/dL (ref 6–20)
CO2: 27 mmol/L (ref 22–32)
Calcium: 9.6 mg/dL (ref 8.9–10.3)
Chloride: 100 mmol/L (ref 98–111)
Creatinine, Ser: 1.16 mg/dL (ref 0.61–1.24)
GFR, Estimated: >60 mL/min (ref >60)
Glucose, Bld: 118 mg/dL — ABNORMAL HIGH (ref 70–99)
Potassium: 3.5 mmol/L (ref 3.5–5.1)
Sodium: 136 mmol/L (ref 135–145)
Total Bilirubin: 1 mg/dL (ref 0.3–1.2)
Total Protein: 7.8 g/dL (ref 6.5–8.1)

## 2022-12-09 LAB — CBC
HCT: 48 % (ref 39.0–52.0)
Hemoglobin: 15.8 g/dL (ref 13.0–17.0)
MCH: 28.8 pg (ref 26.0–34.0)
MCHC: 32.9 g/dL (ref 30.0–36.0)
MCV: 87.6 fL (ref 80.0–100.0)
Platelets: 221 10*3/uL (ref 150–400)
RBC: 5.48 MIL/uL (ref 4.22–5.81)
RDW: 12 % (ref 11.5–15.5)
WBC: 8.5 10*3/uL (ref 4.0–10.5)
nRBC: 0 % (ref 0.0–0.2)

## 2022-12-09 LAB — LIPASE, BLOOD: Lipase: 24 U/L (ref 11–51)

## 2022-12-09 MED ORDER — ACETAMINOPHEN 325 MG PO TABS
650.0000 mg | ORAL_TABLET | Freq: Once | ORAL | Status: AC
  Administered 2022-12-09: 650 mg via ORAL
  Filled 2022-12-09: qty 2

## 2022-12-09 NOTE — ED Triage Notes (Signed)
The pt reports a headache low grade temp generalized body aches since yesterday

## 2022-12-10 MED ORDER — IBUPROFEN 400 MG PO TABS
600.0000 mg | ORAL_TABLET | Freq: Once | ORAL | Status: AC
  Administered 2022-12-10: 600 mg via ORAL
  Filled 2022-12-10: qty 1

## 2022-12-10 NOTE — Discharge Instructions (Signed)
You were seen today and tested positive for COVID-19.  You may return to work or school when you are 24 hours fever free.  Otherwise take Tylenol and Motrin for any body aches or pains.  Make sure that you are staying hydrated.

## 2022-12-10 NOTE — ED Provider Notes (Signed)
[place] EMERGENCY DEPARTMENT AT [facility] Provider Note   CSN: [id number] Arrival date & time: 12/09/22  1918     History  Chief Complaint  Patient presents with   Generalized Body Aches    Terry West is a 27 y.o. male.  HPI     This is a 27 year old male who presents with generalized bodyaches, chills, headache.  Patient reports he began to have a headache yesterday.  Since that time he has reported generalized bodyaches and low-grade temperature.  Temperature 100.9 here.  No neck pain or stiffness.  Denies nausea or vomiting.  General he states he just does not feel well.  No significant cough.  Home Medications Prior to Admission medications   Medication Sig Start Date End Date Taking? Authorizing Provider  cetirizine (ZYRTEC) 10 MG tablet Take 1 tablet (10 mg total) by mouth daily. 08/10/22   Alfonse Spruce, MD  Cholecalciferol (VITAMIN D3) 50 MCG (2000 UT) capsule Take 1 capsule (2,000 Units total) by mouth daily. 11/16/22   Plotnikov, Georgina Quint, MD  Fluticasone-Umeclidin-Vilant (TRELEGY ELLIPTA) 200-62.5-25 MCG/ACT AEPB Inhale 1 puff into the lungs daily. Patient not taking: Reported on 11/06/2022 08/10/22   Alfonse Spruce, MD  montelukast (SINGULAIR) 10 MG tablet Take 1 tablet (10 mg total) by mouth at bedtime. 08/10/22 02/06/23  Alfonse Spruce, MD  naproxen (NAPROSYN) 500 MG tablet Take 1 tablet (500 mg total) by mouth 2 (two) times daily as needed for moderate pain. 11/16/22   Plotnikov, Georgina Quint, MD  VENTOLIN HFA 108 (90 Base) MCG/ACT inhaler Inhale 2 puffs into the lungs every 4 (four) hours as needed for wheezing or shortness of breath. 08/10/22   Alfonse Spruce, MD      Allergies    Patient has no known allergies.    Review of Systems   Review of Systems  Constitutional:  Positive for chills and fever.  Musculoskeletal:  Positive for myalgias.  Neurological:  Positive for headaches.  All other systems reviewed and are  negative.   Physical Exam Updated Vital Signs BP 135/82 (BP Location: Right Arm)   Pulse 99   Temp 98.9 F (37.2 C)   Resp 18   Ht 1.854 m (6\' 1" )   Wt 93.9 kg   SpO2 96%   BMI 27.31 kg/m  Physical Exam Vitals and nursing note reviewed.  Constitutional:      Appearance: He is well-developed. He is not ill-appearing.  HENT:     Head: Normocephalic and atraumatic.     Mouth/Throat:     Mouth: Mucous membranes are moist.  Eyes:     Pupils: Pupils are equal, round, and reactive to light.  Cardiovascular:     Rate and Rhythm: Normal rate and regular rhythm.     Heart sounds: Normal heart sounds. No murmur heard. Pulmonary:     Effort: Pulmonary effort is normal. No respiratory distress.     Breath sounds: Normal breath sounds. No wheezing.  Abdominal:     Palpations: Abdomen is soft.     Tenderness: There is no abdominal tenderness.  Musculoskeletal:     Cervical back: Neck supple.  Lymphadenopathy:     Cervical: No cervical adenopathy.  Skin:    General: Skin is warm and dry.  Neurological:     Mental Status: He is alert and oriented to person, place, and time.  Psychiatric:        Mood and Affect: Mood normal.     ED Results /  Procedures / Treatments   Labs (all labs ordered are listed, but only abnormal results are displayed) Labs Reviewed  RESP PANEL BY RT-PCR (RSV, FLU A&B, COVID)  RVPGX2 - Abnormal; Notable for the following components:      Result Value   SARS Coronavirus 2 by RT PCR POSITIVE (*)    All other components within normal limits  COMPREHENSIVE METABOLIC PANEL - Abnormal; Notable for the following components:   Glucose, Bld 118 (*)    All other components within normal limits  LIPASE, BLOOD  CBC  URINALYSIS, ROUTINE W REFLEX MICROSCOPIC    EKG None  Radiology No results found.  Procedures Procedures    Medications Ordered in ED Medications  ibuprofen (ADVIL) tablet 600 mg (has no administration in time range)  acetaminophen  (TYLENOL) tablet 650 mg (650 mg Oral Given 12/09/22 1955)    ED Course/ Medical Decision Making/ A&P                                 Medical Decision Making  This patient presents to the ED for concern of bodyaches, chills, this involves an extensive number of treatment options, and is a complaint that carries with it a high risk of complications and morbidity.  I considered the following differential and admission for this acute, potentially life threatening condition.  The differential diagnosis includes but illness such as COVID or flu, bacterial infection such as pneumonia  MDM:    This is a 27 year old male who presents with bodyaches, fever, headache.  He is overall nontoxic and vital signs are notable for temperature of 100.9.  Patient given ibuprofen.  Labs obtained and reviewed and largely reassuring with exception of positive COVID.  Patient reports that he is vaccinated.  We discussed supportive measures and quarantine information.  (Labs, imaging, consults)  Labs: I Ordered, and personally interpreted labs.  The pertinent results include: CBC, CMP, lipase  Imaging Studies ordered: I ordered imaging studies including none I independently visualized and interpreted imaging. I agree with the radiologist interpretation  Additional history obtained from chart review.  External records from outside source obtained and reviewed including prior evaluations  Cardiac Monitoring: The patient was not maintained on a cardiac monitor.  If on the cardiac monitor, I personally viewed and interpreted the cardiac monitored which showed an underlying rhythm of: N/A  Reevaluation: After the interventions noted above, I reevaluated the patient and found that they have :stayed the same  Social Determinants of Health:  lives independently  Disposition: Discharge  Co morbidities that complicate the patient evaluation  Past Medical History:  Diagnosis Date   ALLERGIC RHINITIS 07/30/2008    Qualifier: Diagnosis of  By: Posey Rea MD, Georgina Quint    Anxiety    ASTHMA 07/30/2008   Qualifier: Diagnosis of  By: Posey Rea MD, Georgina Quint    Asthma    GERD 11/30/2008   Qualifier: Diagnosis of  By: Posey Rea MD, Georgina Quint    SCOLIOSIS 10/04/2009   LS spine -- to R       Medicines Meds ordered this encounter  Medications   acetaminophen (TYLENOL) tablet 650 mg   ibuprofen (ADVIL) tablet 600 mg    I have reviewed the patients home medicines and have made adjustments as needed  Problem List / ED Course: Problem List Items Addressed This Visit   None Visit Diagnoses     COVID-19    -  Primary  Final Clinical Impression(s) / ED Diagnoses Final diagnoses:  COVID-19    Rx / DC Orders ED Discharge Orders     None         Tsugio Elison, Mayer Masker, MD 12/10/22 [phone]

## 2023-01-15 IMAGING — DX DG MANDIBLE 1-3V
4 series · 4 of 4 positions shown · non-contrast
Comparison: None.

CLINICAL DATA: Fall downstairs.  Facial pain

EXAM:
MANDIBLE - 1-3 VIEW

[mandible pa (1 of 2)]
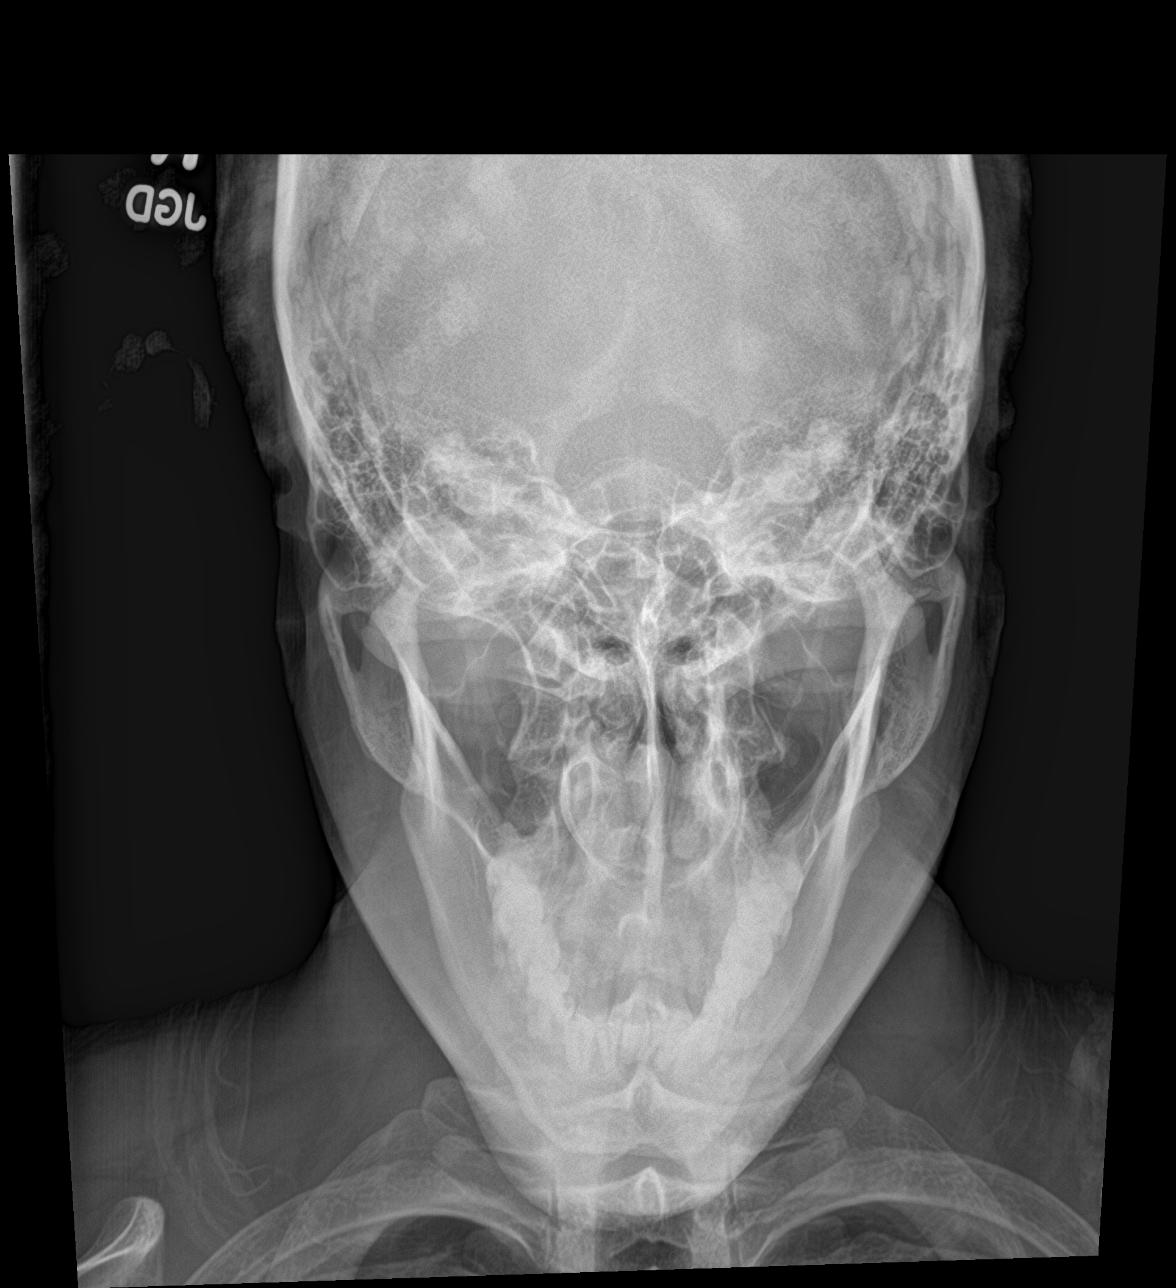

[mandible obl (1 of 2)]
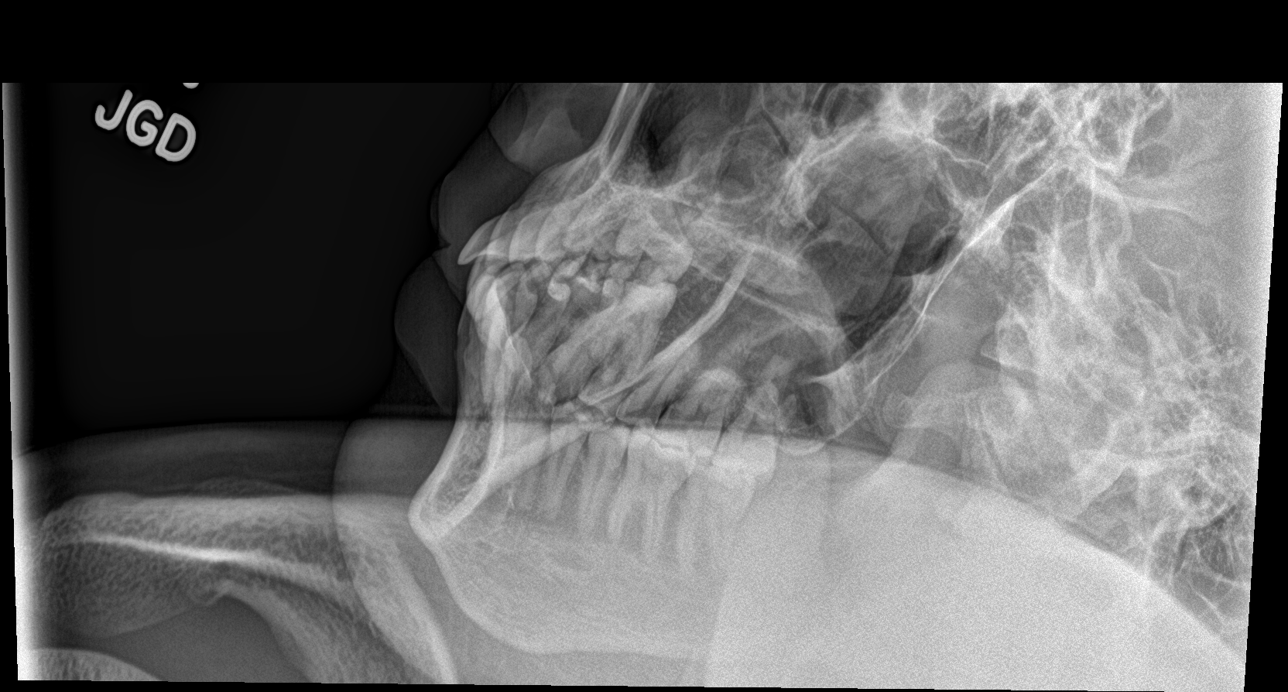

[mandible obl (2 of 2)]
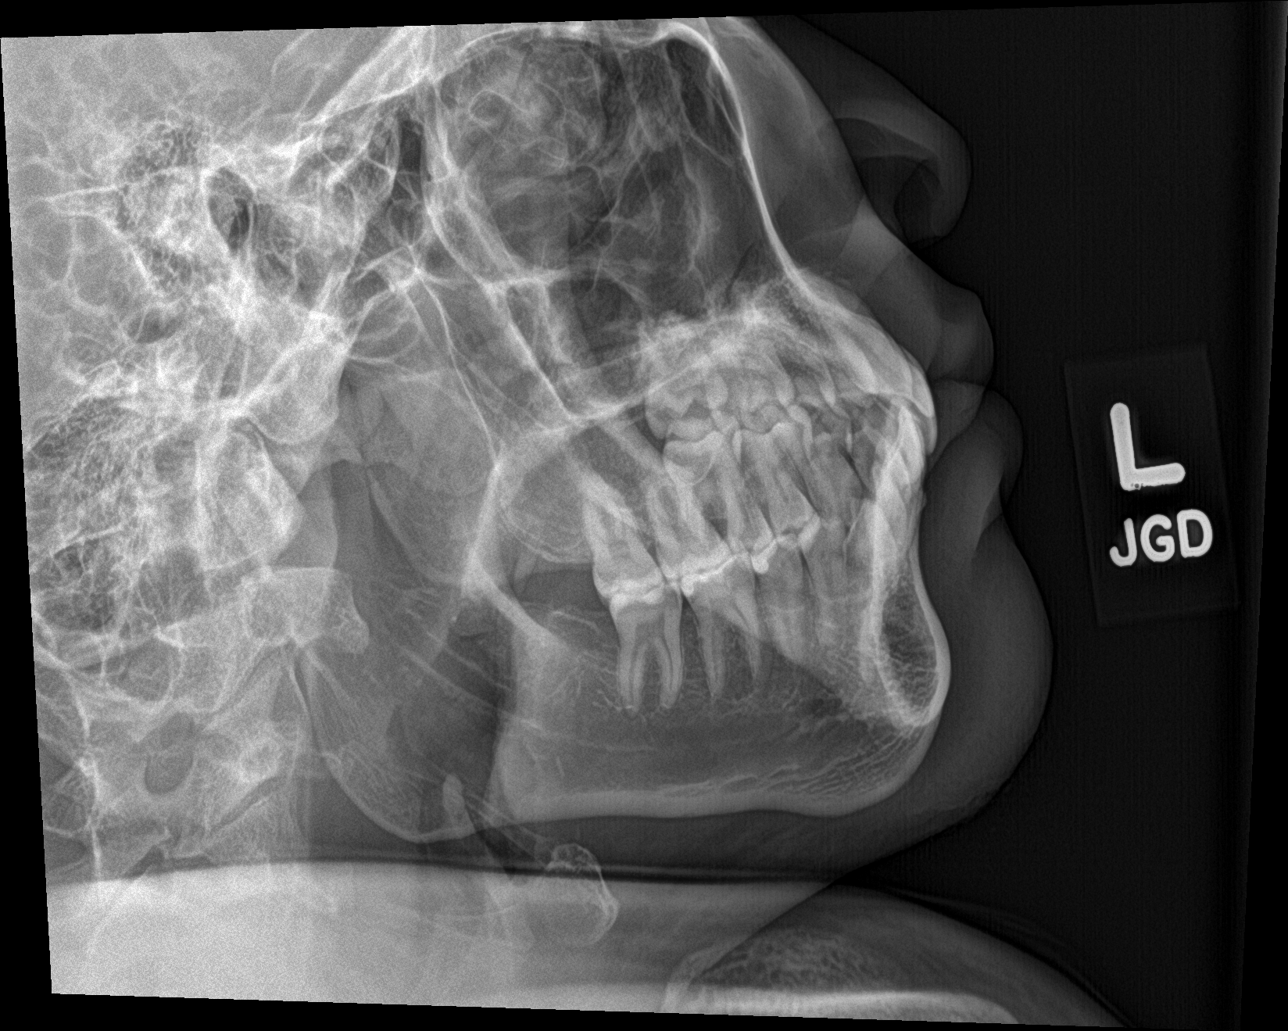

[mandible pa (2 of 2)]
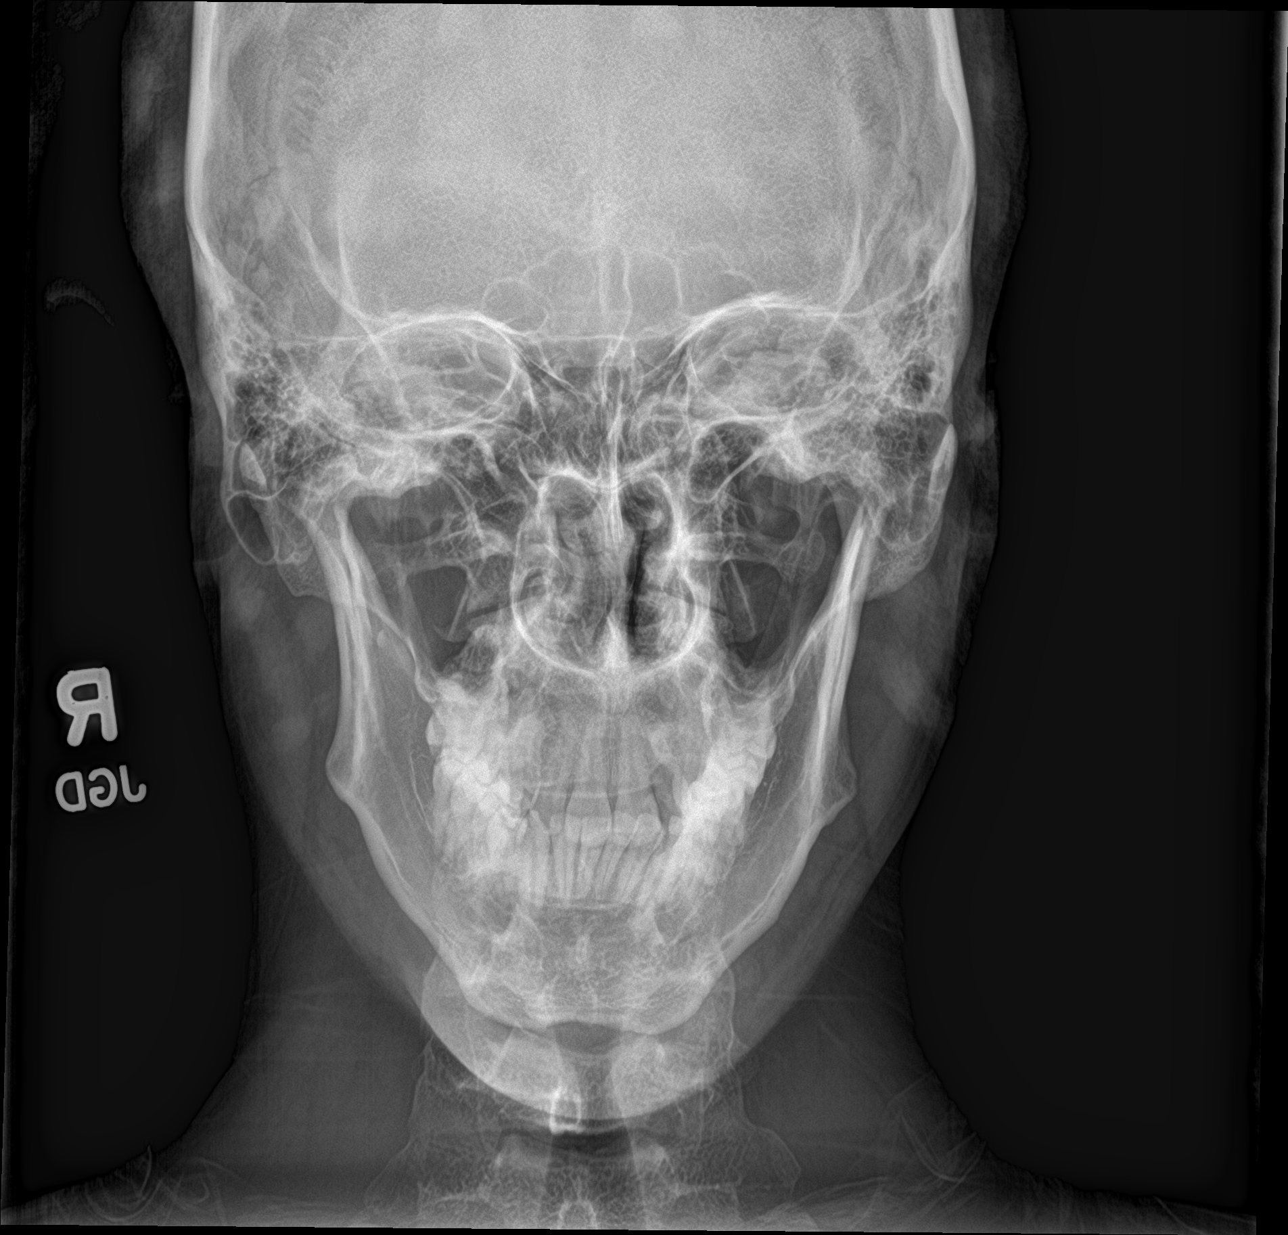

[4 of 4 positions shown; findings below may reference images not displayed]

FINDINGS: No gross evidence mandibular fracture. RIGHT projection is partially
obscured by soft tissues of the shoulders. Mandibular condyles
appear located.
IMPRESSION: No radiographic evidence of fracture. Some limitation related to
positioning.

## 2023-12-11 ENCOUNTER — Encounter (HOSPITAL_COMMUNITY): Payer: Self-pay | Admitting: Emergency Medicine

## 2023-12-11 ENCOUNTER — Emergency Department (HOSPITAL_COMMUNITY)
Admission: EM | Admit: 2023-12-11 | Discharge: 2023-12-12 | Attending: Emergency Medicine | Admitting: Emergency Medicine

## 2023-12-11 DIAGNOSIS — S0181XA Laceration without foreign body of other part of head, initial encounter: Secondary | ICD-10-CM | POA: Diagnosis not present

## 2023-12-11 DIAGNOSIS — S0992XA Unspecified injury of nose, initial encounter: Secondary | ICD-10-CM | POA: Diagnosis present

## 2023-12-11 DIAGNOSIS — S0232XA Fracture of orbital floor, left side, initial encounter for closed fracture: Secondary | ICD-10-CM | POA: Insufficient documentation

## 2023-12-11 DIAGNOSIS — S022XXA Fracture of nasal bones, initial encounter for closed fracture: Secondary | ICD-10-CM | POA: Insufficient documentation

## 2023-12-11 DIAGNOSIS — Z23 Encounter for immunization: Secondary | ICD-10-CM | POA: Insufficient documentation

## 2023-12-11 DIAGNOSIS — Z79899 Other long term (current) drug therapy: Secondary | ICD-10-CM | POA: Insufficient documentation

## 2023-12-11 DIAGNOSIS — Y30XXXA Falling, jumping or pushed from a high place, undetermined intent, initial encounter: Secondary | ICD-10-CM | POA: Insufficient documentation

## 2023-12-11 DIAGNOSIS — S1081XA Abrasion of other specified part of neck, initial encounter: Secondary | ICD-10-CM | POA: Insufficient documentation

## 2023-12-11 DIAGNOSIS — S0285XA Fracture of orbit, unspecified, initial encounter for closed fracture: Secondary | ICD-10-CM

## 2023-12-11 DIAGNOSIS — S0990XA Unspecified injury of head, initial encounter: Secondary | ICD-10-CM
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
## 2023-12-12 ENCOUNTER — Emergency Department (HOSPITAL_COMMUNITY)

## 2023-12-12 MED ORDER — HYDROCODONE-ACETAMINOPHEN 5-325 MG PO TABS
1.0000 | ORAL_TABLET | Freq: Once | ORAL | Status: AC
  Administered 2023-12-12: 1 via ORAL
  Filled 2023-12-12: qty 1

## 2023-12-12 MED ORDER — LIDOCAINE-EPINEPHRINE (PF) 2 %-1:200000 IJ SOLN
10.0000 mL | Freq: Once | INTRAMUSCULAR | Status: AC
  Administered 2023-12-12: 10 mL
  Filled 2023-12-12: qty 20

## 2023-12-12 MED ORDER — NAPROXEN 500 MG PO TABS
500.0000 mg | ORAL_TABLET | Freq: Two times a day (BID) | ORAL | 0 refills | Status: AC
Start: 2023-12-12 — End: ?

## 2023-12-12 MED ORDER — BACITRACIN ZINC 500 UNIT/GM EX OINT
1.0000 | TOPICAL_OINTMENT | Freq: Two times a day (BID) | CUTANEOUS | 0 refills | Status: AC
Start: 2023-12-12 — End: ?

## 2023-12-12 MED ORDER — TETANUS-DIPHTH-ACELL PERTUSSIS 5-2.5-18.5 LF-MCG/0.5 IM SUSY
0.5000 mL | PREFILLED_SYRINGE | Freq: Once | INTRAMUSCULAR | Status: AC
  Administered 2023-12-12: 0.5 mL via INTRAMUSCULAR
  Filled 2023-12-12: qty 0.5

## 2023-12-12 MED ORDER — ACETAMINOPHEN 500 MG PO TABS: 1000.0000 mg | ORAL_TABLET | Freq: Three times a day (TID) | ORAL | 0 refills | Status: AC | PRN
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
## 2023-12-12 NOTE — Discharge Instructions (Addendum)
 I would recommend Tylenol  and Naproxen  for pain control.  You can also use ice. Try to avoid trauma or nose blowing due to your broken nose.  Apply bacitracin  twice daily to sutures.  Have sutures removed and 7 to 10 days.  3 were placed.  Return to ER with signs of infection.

## 2023-12-12 NOTE — ED Notes (Signed)
 Pt right eye 20/20 acuity. Pt left eye 20/40 acuity
